# Patient Record
Sex: Female | Born: 1990 | Race: White | Hispanic: No | Marital: Married | Smoking: Never smoker
Health system: Southern US, Community
[De-identification: ages and names within clinical notes are randomized; demographics above are authoritative.]

## PROBLEM LIST (undated history)

## (undated) DIAGNOSIS — E7212 Methylenetetrahydrofolate reductase deficiency: Secondary | ICD-10-CM

## (undated) DIAGNOSIS — E079 Disorder of thyroid, unspecified: Secondary | ICD-10-CM

## (undated) DIAGNOSIS — Z1589 Genetic susceptibility to other disease: Secondary | ICD-10-CM

## (undated) DIAGNOSIS — F329 Major depressive disorder, single episode, unspecified: Secondary | ICD-10-CM

## (undated) DIAGNOSIS — N946 Dysmenorrhea, unspecified: Secondary | ICD-10-CM

## (undated) DIAGNOSIS — Z77120 Contact with and (suspected) exposure to mold (toxic): Secondary | ICD-10-CM

## (undated) DIAGNOSIS — E039 Hypothyroidism, unspecified: Secondary | ICD-10-CM

## (undated) DIAGNOSIS — G43909 Migraine, unspecified, not intractable, without status migrainosus: Secondary | ICD-10-CM

## (undated) DIAGNOSIS — F419 Anxiety disorder, unspecified: Secondary | ICD-10-CM

## (undated) DIAGNOSIS — F32A Depression, unspecified: Secondary | ICD-10-CM

## (undated) HISTORY — PX: WISDOM TOOTH EXTRACTION: SHX21

## (undated) HISTORY — DX: Contact with and (suspected) exposure to mold (toxic): Z77.120

## (undated) HISTORY — DX: Hypothyroidism, unspecified: E03.9

## (undated) HISTORY — DX: Genetic susceptibility to other disease: Z15.89

## (undated) HISTORY — DX: Anxiety disorder, unspecified: F41.9

## (undated) HISTORY — DX: Major depressive disorder, single episode, unspecified: F32.9

## (undated) HISTORY — PX: ABDOMINAL SURGERY: SHX537

## (undated) HISTORY — DX: Dysmenorrhea, unspecified: N94.6

## (undated) HISTORY — DX: Disorder of thyroid, unspecified: E07.9

## (undated) HISTORY — DX: Methylenetetrahydrofolate reductase deficiency: E72.12

## (undated) HISTORY — DX: Depression, unspecified: F32.A

## (undated) HISTORY — DX: Migraine, unspecified, not intractable, without status migrainosus: G43.909

---
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
## 2013-03-15 ENCOUNTER — Other Ambulatory Visit (HOSPITAL_COMMUNITY)
Admission: RE | Admit: 2013-03-15 | Discharge: 2013-03-15 | Disposition: A | Discharge status: Home / Self Care | Payer: 59 | Source: Ambulatory Visit | Attending: Family Medicine | Admitting: Family Medicine

## 2013-03-15 ENCOUNTER — Other Ambulatory Visit: Payer: Self-pay | Admitting: Family Medicine

## 2013-03-15 DIAGNOSIS — Z124 Encounter for screening for malignant neoplasm of cervix: Secondary | ICD-10-CM | POA: Insufficient documentation

## 2015-06-10 DIAGNOSIS — J160 Chlamydial pneumonia: Secondary | ICD-10-CM

## 2015-06-10 HISTORY — DX: Chlamydial pneumonia: J16.0

## 2016-01-23 ENCOUNTER — Other Ambulatory Visit: Payer: Self-pay | Admitting: Family Medicine

## 2016-01-23 ENCOUNTER — Other Ambulatory Visit (HOSPITAL_COMMUNITY)
Admission: RE | Admit: 2016-01-23 | Discharge: 2016-01-23 | Disposition: A | Discharge status: Home / Self Care | Payer: Managed Care, Other (non HMO) | Source: Ambulatory Visit | Attending: Family Medicine | Admitting: Family Medicine

## 2016-01-23 DIAGNOSIS — N6002 Solitary cyst of left breast: Secondary | ICD-10-CM

## 2016-01-23 DIAGNOSIS — Z124 Encounter for screening for malignant neoplasm of cervix: Secondary | ICD-10-CM | POA: Insufficient documentation

## 2016-01-28 LAB — CYTOLOGY - PAP

## 2016-01-29 ENCOUNTER — Ambulatory Visit
Admission: RE | Admit: 2016-01-29 | Discharge: 2016-01-29 | Disposition: A | Discharge status: Home / Self Care | Payer: Managed Care, Other (non HMO) | Source: Ambulatory Visit | Attending: Family Medicine | Admitting: Family Medicine

## 2016-01-29 DIAGNOSIS — N6002 Solitary cyst of left breast: Secondary | ICD-10-CM

## 2018-05-19 ENCOUNTER — Encounter: Payer: Self-pay | Admitting: Obstetrics and Gynecology

## 2018-06-09 NOTE — L&D Delivery Note (Signed)
Delivery Note At 6:25 PM a viable female was delivered via Vaginal, Spontaneous (Presentation: Middle Occiput Anterior).  APGAR: 9, 10; weight  .   Placenta status: Spontaneous, Intact.  Cord: 3 vessels with the following complications: None.  Anesthesia: General Episiotomy: Median Lacerations: Deep left sulcal with left labial extension' 2nd degree perineal Suture Repair: 3-0 vicryl, 2-vicryl Est. Blood Loss (mL):  1100cc  Upon delivery of infant and delivery of placenta, patient found to be comfortable with epidural anesthesia. Deep left sulcal laceration noted extending cranially 4cm. Dr Rip Harbour of [facility] called for assistance with retraction. Left sulcal closed in two layers for running locked 3-0 vicryl suture. Surgeon's finger used to ensure that dead space was closed, confirmed. 1cm labial extension on left repaired in running fashion. At this time, 2nd degree perineal laceration repaired with 2-0 vicryl suture. Surgeon's finger performed rectal exam without evidence of suture or collection concerning for hematoma/collection.   During completion of running subcuticular stitch for 2nd deg repair, pt noted to become hypotensive and nauseous with pallor. Please note - excellent hemostasis had been achieved by this time and uterus had remained firm throughout. Code hemorrhage was called and team placed Foley, 2nd IV, hung boluses x2 and drew labs (rainbow). Foley placed [id number] by Psychologist, sport and exercise. Upon completion of assessment and one-time phenylephrine, pt Bps now 100s/60s (as low as 50/29 previously). Repeat rectal exam as before. Patient with slowly improving color, nausea improved. Foley to stay in place. Will follow labs and VS  Patient stable upon completion of repair with excellent hemostasis, doing skin to skin with infant son whom she wants to breastfeed.  Mom to postpartum.  Baby to Couplet care / Skin to Skin.  Graham 05/23/2019, 7:33 PM

## 2018-06-10 ENCOUNTER — Other Ambulatory Visit: Payer: Self-pay

## 2018-06-10 ENCOUNTER — Ambulatory Visit (INDEPENDENT_AMBULATORY_CARE_PROVIDER_SITE_OTHER): Payer: 59 | Admitting: Obstetrics and Gynecology

## 2018-06-10 ENCOUNTER — Encounter: Payer: Self-pay | Admitting: Obstetrics and Gynecology

## 2018-06-10 VITALS — BP 118/82 | HR 64 | Wt 148.8 lb

## 2018-06-10 DIAGNOSIS — Z3009 Encounter for other general counseling and advice on contraception: Secondary | ICD-10-CM

## 2018-06-10 DIAGNOSIS — Z01419 Encounter for gynecological examination (general) (routine) without abnormal findings: Secondary | ICD-10-CM | POA: Diagnosis not present

## 2018-06-10 DIAGNOSIS — N926 Irregular menstruation, unspecified: Secondary | ICD-10-CM | POA: Diagnosis not present

## 2018-06-10 DIAGNOSIS — Z124 Encounter for screening for malignant neoplasm of cervix: Secondary | ICD-10-CM | POA: Diagnosis not present

## 2018-06-10 LAB — POCT URINE PREGNANCY: Preg Test, Ur: NEGATIVE

## 2018-06-10 NOTE — Progress Notes (Signed)
28 y.o. G0P0000 Married White or Caucasian Not Hispanic or Latino female here for annual exam.   Period Cycle (Days): 27 Period Duration (Days): 3 days Period Pattern: Regular Menstrual Flow: Light, Heavy Menstrual Control: Other (Comment)(diva cup) Menstrual Control Change Freq (Hours): empties diva cup every 8-12 hours Dysmenorrhea: (!) Moderate Dysmenorrhea Symptoms: Cramping  The second day is heavy and significant cramps.  She is homozygote for MTHFR gene, her homocystine level was slightly off, she is on vitamins.  She is considering pregnancy. She plans to wean off of her supplements.    Husband is healthy. No h/o genetic or chromosomal abnormalities on either side (other that MTHFR def)  Patient's last menstrual period was 05/11/2018 (exact date).          Sexually active: Yes.    The current method of family planning is condoms most of the time.    Exercising: Yes.    running, lifting, weights, walking Smoker:  no  Health Maintenance: Pap:  2017 normal per patient History of abnormal Pap:  no MMG: Left breast ultrasound 2017 normal per patient TDaP:  07/2008 does this with PCP Gardasil: completed all 3   reports that she has never smoked. She has never used smokeless tobacco. She reports current alcohol use of about 2.0 - 4.0 standard drinks of alcohol per week. She reports that she does not use drugs. 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 Will stay long term.   Past Medical History:  Diagnosis Date  . Anxiety   . Contact with and (suspected) exposure to mold (toxic)   . Depression   . Dysmenorrhea   . Hypothyroidism   . Migraines   . MTHFR gene mutation (HCC)   . Thyroid disease   Not having issues with Depression and Anxiety currently.   Past Surgical History:  Procedure Laterality Date  . ABDOMINAL SURGERY     hernia  . WISDOM TOOTH EXTRACTION      Current Outpatient Medications   Medication Sig Dispense Refill  . cetirizine (ZYRTEC) 10 MG tablet Take 10 mg by mouth daily.    Marland Kitchen CREATINE PO Take by mouth.    . cyanocobalamin 2000 MCG tablet Take 2,000 mcg by mouth daily.    . IRON PO Take by mouth. 36 mg daily    . Lactobacillus (PROBIOTIC ACIDOPHILUS PO) Take by mouth.    Marland Kitchen MAGNESIUM MALATE PO Take by mouth.    . Melatonin 10 MG CAPS Take by mouth.    . Omega-3 Fatty Acids (FISH OIL) 500 MG CAPS Take 3 capsules by mouth.    Marland Kitchen PHOSPHATIDYL CHOLINE PO Take by mouth.    . Pregnenolone POWD by Does not apply route.    Marland Kitchen QUERCETIN PO Take by mouth. 29 mg daily    . thyroid (ARMOUR) 30 MG tablet Take 30 mg by mouth daily before breakfast. Patient is taking 45 mg daily.    . TURMERIC PO Take by mouth.    Marland Kitchen UNABLE TO FIND Okra pepsin 2 a day    . UNABLE TO FIND Methyl-8 complex vitamin 1 lozenge daily    . Vitamin D, Ergocalciferol, (DRISDOL) 1.25 MG (50000 UT) CAPS capsule Take 50,000 Units by mouth every 7 (seven) days.    Marland Kitchen VITAMIN K PO Take by mouth.    . zinc gluconate 50 MG tablet Take 50 mg by mouth daily.     No  current facility-administered medications for this visit.     History reviewed. No pertinent family history.  Review of Systems  Constitutional: Negative.   HENT: Negative.   Eyes: Negative.   Respiratory: Negative.   Cardiovascular: Negative.   Gastrointestinal: Negative.   Endocrine: Negative.   Genitourinary: Negative.   Musculoskeletal: Negative.   Skin: Negative.   Allergic/Immunologic: Negative.   Neurological: Negative.   Hematological: Negative.   Psychiatric/Behavioral: Negative.     Exam:   BP 118/82 (BP Location: Right Arm, Patient Position: Sitting, Cuff Size: Normal)   Pulse 64   Wt 148 lb 12.8 oz (67.5 kg)   LMP 05/11/2018 (Exact Date)   Weight change: @WEIGHTCHANGE @ Height:      Ht Readings from Last 3 Encounters:  No data found for Ht    General appearance: alert, cooperative and appears stated age Head:  Normocephalic, without obvious abnormality, atraumatic Neck: no adenopathy, supple, symmetrical, trachea midline and thyroid normal to inspection and palpation Lungs: clear to auscultation bilaterally Cardiovascular: regular rate and rhythm Breasts: normal appearance, no masses or tenderness Abdomen: soft, non-tender; non distended,  no masses,  no organomegaly Extremities: extremities normal, atraumatic, no cyanosis or edema Skin: Skin color, texture, turgor normal. No rashes or lesions Lymph nodes: Cervical, supraclavicular, and axillary nodes normal. No abnormal inguinal nodes palpated Neurologic: Grossly normal   Pelvic: External genitalia:  no lesions              Urethra:  normal appearing urethra with no masses, tenderness or lesions              Bartholins and Skenes: normal                 Vagina: normal appearing vagina with normal color and discharge, no lesions              Cervix: no lesions               Bimanual Exam:  Uterus:  normal size, contour, position, consistency, mobility, non-tender and retroverted              Adnexa: no mass, fullness, tenderness               Rectovaginal: Confirms               Anus:  normal sphincter tone, no lesions  Chaperone was present for exam.  A:  Well Woman with normal exam  MTHFR gene mutation  Family planning  P:   Pap with reflex hpv  UPT negative(cycle is 4 days late). If no cycle by next week, she should call  Rubella today  Offered CF testing declined  Recommended she come off of supplements and go on PNV, the folic acid in the PNV is enough with her gene mutation  She should not be at an increased risk of clotting  Discussed breast self exam  Discussed calcium and vit D intake  Other labs with primary  Discussed good health prior to pregnancy  Other labs with primary

## 2018-06-10 NOTE — Patient Instructions (Signed)
Preparing for Pregnancy If you are considering becoming pregnant, make an appointment to see your regular health care provider to learn how to prepare for a safe and healthy pregnancy (preconception care). During a preconception care visit, your health care provider will:  Do a complete physical exam, including a Pap test.  Take a complete medical history.  Give you information, answer your questions, and help you resolve problems. Preconception checklist Medical history  Tell your health care provider about any current or past medical conditions. Your pregnancy or your ability to become pregnant may be affected by chronic conditions, such as diabetes, chronic hypertension, and thyroid problems.  Include your family's medical history as well as your partner's medical history.  Tell your health care provider about any history of STIs (sexually transmitted infections).These can affect your pregnancy. In some cases, they can be passed to your baby. Discuss any concerns that you have about STIs.  If indicated, discuss the benefits of genetic testing. This testing will show whether there are any genetic conditions that may be passed from you or your partner to your baby.  Tell your health care provider about: ? Any problems you have had with conception or pregnancy. ? Any medicines you take. These include vitamins, herbal supplements, and over-the-counter medicines. ? Your history of immunizations. Discuss any vaccinations that you may need. Diet  Ask your health care provider what to include in a healthy diet that has a balance of nutrients. This is especially important when you are pregnant or preparing to become pregnant.  Ask your health care provider to help you reach a healthy weight before pregnancy. ? If you are overweight, you may be at higher risk for certain complications, such as high blood pressure, diabetes, and preterm birth. ? If you are underweight, you are more likely to  have a baby who has a low birth weight. Lifestyle, work, and home  Let your health care provider know: ? About any lifestyle habits that you have, such as alcohol use, drug use, or smoking. ? About recreational activities that may put you at risk during pregnancy, such as downhill skiing and certain exercise programs. ? Tell your health care provider about any international travel, especially any travel to places with an active Zika virus outbreak. ? About harmful substances that you may be exposed to at work or at home. These include chemicals, pesticides, radiation, or even litter boxes. ? If you do not feel safe at home. Mental health  Tell your health care provider about: ? Any history of mental health conditions, including feelings of depression, sadness, or anxiety. ? Any medicines that you take for a mental health condition. These include herbs and supplements. Home instructions to prepare for pregnancy Lifestyle   Eat a balanced diet. This includes fresh fruits and vegetables, whole grains, lean meats, low-fat dairy products, healthy fats, and foods that are high in fiber. Ask to meet with a nutritionist or registered dietitian for assistance with meal planning and goals.  Get regular exercise. Try to be active for at least 30 minutes a day on most days of the week. Ask your health care provider which activities are safe during pregnancy.  Do not use any products that contain nicotine or tobacco, such as cigarettes and e-cigarettes. If you need help quitting, ask your health care provider.  Do not drink alcohol.  Do not take illegal drugs.  Maintain a healthy weight. Ask your health care provider what weight range is right for you. General   instructions  Keep an accurate record of your menstrual periods. This makes it easier for your health care provider to determine your baby's due date.  Begin taking prenatal vitamins and folic acid supplements daily as directed by your  health care provider.  Manage any chronic conditions, such as high blood pressure and diabetes, as told by your health care provider. This is important. How do I know that I am pregnant? You may be pregnant if you have been sexually active and you miss your period. Symptoms of early pregnancy include:  Mild cramping.  Very light vaginal bleeding (spotting).  Feeling unusually tired.  Nausea and vomiting (morning sickness). If you have any of these symptoms and you suspect that you might be pregnant, you can take a home pregnancy test. These tests check for a hormone in your urine (human chorionic gonadotropin, or hCG). A woman's body begins to make this hormone during early pregnancy. These tests are very accurate. Wait until at least the first day after you miss your period to take one. If the test shows that you are pregnant (you get a positive result), call your health care provider to make an appointment for prenatal care. What should I do if I become pregnant?      Make an appointment with your health care provider as soon as you suspect you are pregnant.  Do not use any products that contain nicotine, such as cigarettes, chewing tobacco, and e-cigarettes. If you need help quitting, ask your health care provider.  Do not drink alcoholic beverages. Alcohol is related to a number of birth defects.  Avoid toxic odors and chemicals.  You may continue to have sexual intercourse if it does not cause pain or other problems, such as vaginal bleeding. This information is not intended to replace advice given to you by your health care provider. Make sure you discuss any questions you have with your health care provider. Document Released: 05/08/2008 Document Revised: 05/28/2017 Document Reviewed: 12/16/2015 Elsevier Interactive Patient Education  2019 [employer].   EXERCISE AND DIET:  We recommended that you start or continue a regular exercise program for good health. Regular exercise  means any activity that makes your heart beat faster and makes you sweat.  We recommend exercising at least 30 minutes per day at least 3 days a week, preferably 4 or 5.  We also recommend a diet low in fat and sugar.  Inactivity, poor dietary choices and obesity can cause diabetes, heart attack, stroke, and kidney damage, among others.    ALCOHOL AND SMOKING:  Women should limit their alcohol intake to no more than 7 drinks/beers/glasses of wine (combined, not each!) per week. Moderation of alcohol intake to this level decreases your risk of breast cancer and liver damage. And of course, no recreational drugs are part of a healthy lifestyle.  And absolutely no smoking or even second hand smoke. Most people know smoking can cause heart and lung diseases, but did you know it also contributes to weakening of your bones? Aging of your skin?  Yellowing of your teeth and nails?  CALCIUM AND VITAMIN D:  Adequate intake of calcium and Vitamin D are recommended.  The recommendations for exact amounts of these supplements seem to change often, but generally speaking 1,000 mg of calcium (between diet and supplement) and 800 units of Vitamin D per day seems prudent. Certain women may benefit from higher intake of Vitamin D.  If you are among these women, your doctor will have told  you during your visit.    PAP SMEARS:  Pap smears, to check for cervical cancer or precancers,  have traditionally been done yearly, although recent scientific advances have shown that most women can have pap smears less often.  However, every woman still should have a physical exam from her gynecologist every year. It will include a breast check, inspection of the vulva and vagina to check for abnormal growths or skin changes, a visual exam of the cervix, and then an exam to evaluate the size and shape of the uterus and ovaries.  And after 27 years of age, a rectal exam is indicated to check for rectal cancers. We will also provide age  appropriate advice regarding health maintenance, like when you should have certain vaccines, screening for sexually transmitted diseases, bone density testing, colonoscopy, mammograms, etc.   MAMMOGRAMS:  All women over 27 years old should have a yearly mammogram. Many facilities now offer a "3D" mammogram, which may cost around $50 extra out of pocket. If possible,  we recommend you accept the option to have the 3D mammogram performed.  It both reduces the number of women who will be called back for extra views which then turn out to be normal, and it is better than the routine mammogram at detecting truly abnormal areas.    COLON CANCER SCREENING: Now recommend starting at age 745. At this time colonoscopy is not covered for routine screening until 50. There are take home tests that can be done between 45-49.   COLONOSCOPY:  Colonoscopy to screen for colon cancer is recommended for all women at age 27.  We know, you hate the idea of the prep.  We agree, BUT, having colon cancer and not knowing it is worse!!  Colon cancer so often starts as a polyp that can be seen and removed at colonscopy, which can quite literally save your life!  And if your first colonoscopy is normal and you have no family history of colon cancer, most women don't have to have it again for 10 years.  Once every ten years, you can do something that may end up saving your life, right?  We will be happy to help you get it scheduled when you are ready.  Be sure to check your insurance coverage so you understand how much it will cost.  It may be covered as a preventative service at no cost, but you should check your particular policy.      Breast Self-Awareness Breast self-awareness means being familiar with how your breasts look and feel. It involves checking your breasts regularly and reporting any changes to your health care provider. Practicing breast self-awareness is important. A change in your breasts can be a sign of a serious  medical problem. Being familiar with how your breasts look and feel allows you to find any problems early, when treatment is more likely to be successful. All women should practice breast self-awareness, including women who have had breast implants. How to do a breast self-exam One way to learn what is normal for your breasts and whether your breasts are changing is to do a breast self-exam. To do a breast self-exam: Look for Changes  1. Remove all the clothing above your waist. 2. Stand in front of a mirror in a room with good lighting. 3. Put your hands on your hips. 4. Push your hands firmly downward. 5. Compare your breasts in the mirror. Look for differences between them (asymmetry), such as: ? Differences in shape. ?  Differences in size. °? Puckers, dips, and bumps in one breast and not the other. °6. Look at each breast for changes in your skin, such as: °? Redness. °? Scaly areas. °7. Look for changes in your nipples, such as: °? Discharge. °? Bleeding. °? Dimpling. °? Redness. °? A change in position. °Feel for Changes °Carefully feel your breasts for lumps and changes. It is best to do this while lying on your back on the floor and again while sitting or standing in the shower or tub with soapy water on your skin. Feel each breast in the following way: °· Place the arm on the side of the breast you are examining above your head. °· Feel your breast with the other hand. °· Start in the nipple area and make ¾ inch (2 cm) overlapping circles to feel your breast. Use the pads of your three middle fingers to do this. Apply light pressure, then medium pressure, then firm pressure. The light pressure will allow you to feel the tissue closest to the skin. The medium pressure will allow you to feel the tissue that is a little deeper. The firm pressure will allow you to feel the tissue close to the ribs. °· Continue the overlapping circles, moving downward over the breast until you feel your ribs below  your breast. °· Move one finger-width toward the center of the body. Continue to use the ¾ inch (2 cm) overlapping circles to feel your breast as you move slowly up toward your collarbone. °· Continue the up and down exam using all three pressures until you reach your armpit. ° °Write Down What You Find ° °Write down what is normal for each breast and any changes that you find. Keep a written record with breast changes or normal findings for each breast. By writing this information down, you do not need to depend only on memory for size, tenderness, or location. Write down where you are in your menstrual cycle, if you are still menstruating. °If you are having trouble noticing differences in your breasts, do not get discouraged. With time you will become more familiar with the variations in your breasts and more comfortable with the exam. °How often should I examine my breasts? °Examine your breasts every month. If you are breastfeeding, the best time to examine your breasts is after a feeding or after using a breast pump. If you menstruate, the best time to examine your breasts is 5-7 days after your period is over. During your period, your breasts are lumpier, and it may be more difficult to notice changes. °When should I see my health care provider? °See your health care provider if you notice: °· A change in shape or size of your breasts or nipples. °· A change in the skin of your breast or nipples, such as a reddened or scaly area. °· Unusual discharge from your nipples. °· A lump or thick area that was not there before. °· Pain in your breasts. °· Anything that concerns you. ° °

## 2018-06-14 LAB — RUBELLA SCREEN: RUBELLA: 8.5 {index} (ref >0.99)

## 2018-06-15 ENCOUNTER — Other Ambulatory Visit (HOSPITAL_COMMUNITY)
Admission: RE | Admit: 2018-06-15 | Discharge: 2018-06-15 | Disposition: A | Discharge status: Home / Self Care | Payer: 59 | Source: Ambulatory Visit | Attending: Obstetrics and Gynecology | Admitting: Obstetrics and Gynecology

## 2018-06-15 DIAGNOSIS — Z124 Encounter for screening for malignant neoplasm of cervix: Secondary | ICD-10-CM | POA: Insufficient documentation

## 2018-06-15 NOTE — Addendum Note (Signed)
Addended by: Tobi BastosJERTSON, Renn Stille E on: 06/15/2018 11:09 AM   Modules accepted: Orders

## 2018-06-18 LAB — CYTOLOGY - PAP: Diagnosis: NEGATIVE

## 2018-09-20 ENCOUNTER — Telehealth: Payer: Self-pay | Admitting: Obstetrics and Gynecology

## 2018-09-20 NOTE — Telephone Encounter (Signed)
Spoke with patient. Patients LMP was 08/25/2018. Took UPT that is positive. Denies any pain or bleeding. Had mild cramping early on, but has none currently. Denies any concerns or problems. No history of pelvic infection or surgery. Provided contact information to [facility] and [facility] for patient to call and schedule an appointment as our office is unable to do pregnancy confirmation appointments due to COVID 19 at this time. Patient verbalizes understanding. Advised if she has any bleeding or pain will need to contact the office immediately for evaluation or be seen in the ER. Patient verbalizes understanding.  Routing to provider and will close encounter.

## 2018-09-20 NOTE — Telephone Encounter (Signed)
Patient would like to come in for pregnancy confirmation. 

## 2018-10-27 LAB — OB RESULTS CONSOLE HIV ANTIBODY (ROUTINE TESTING): HIV: NONREACTIVE

## 2018-10-27 LAB — OB RESULTS CONSOLE GC/CHLAMYDIA
Chlamydia: NEGATIVE
Gonorrhea: NEGATIVE

## 2018-10-27 LAB — OB RESULTS CONSOLE RUBELLA ANTIBODY, IGM: Rubella: IMMUNE

## 2018-10-27 LAB — OB RESULTS CONSOLE VARICELLA ZOSTER ANTIBODY, IGG: Varicella: IMMUNE

## 2018-10-27 LAB — OB RESULTS CONSOLE HEPATITIS B SURFACE ANTIGEN: Hepatitis B Surface Ag: NEGATIVE

## 2018-11-23 ENCOUNTER — Other Ambulatory Visit (HOSPITAL_COMMUNITY): Payer: Self-pay | Admitting: Obstetrics and Gynecology

## 2018-11-23 DIAGNOSIS — Z1329 Encounter for screening for other suspected endocrine disorder: Secondary | ICD-10-CM

## 2018-11-23 DIAGNOSIS — Z3689 Encounter for other specified antenatal screening: Secondary | ICD-10-CM

## 2018-11-23 DIAGNOSIS — Z3A18 18 weeks gestation of pregnancy: Secondary | ICD-10-CM

## 2018-12-27 ENCOUNTER — Encounter (HOSPITAL_COMMUNITY): Payer: Self-pay | Admitting: *Deleted

## 2018-12-29 ENCOUNTER — Encounter (HOSPITAL_COMMUNITY): Payer: Self-pay

## 2018-12-29 ENCOUNTER — Other Ambulatory Visit: Payer: Self-pay

## 2018-12-29 ENCOUNTER — Ambulatory Visit (HOSPITAL_COMMUNITY)
Admission: RE | Admit: 2018-12-29 | Discharge: 2018-12-29 | Disposition: A | Discharge status: Home / Self Care | Payer: 59 | Source: Ambulatory Visit | Attending: Obstetrics and Gynecology | Admitting: Obstetrics and Gynecology

## 2018-12-29 ENCOUNTER — Ambulatory Visit (HOSPITAL_COMMUNITY): Payer: 59 | Admitting: *Deleted

## 2018-12-29 VITALS — BP 113/64 | HR 88 | Temp 98.4°F | Ht 64.0 in

## 2018-12-29 DIAGNOSIS — Z79899 Other long term (current) drug therapy: Secondary | ICD-10-CM | POA: Insufficient documentation

## 2018-12-29 DIAGNOSIS — O99282 Endocrine, nutritional and metabolic diseases complicating pregnancy, second trimester: Secondary | ICD-10-CM | POA: Diagnosis not present

## 2018-12-29 DIAGNOSIS — O2692 Pregnancy related conditions, unspecified, second trimester: Secondary | ICD-10-CM

## 2018-12-29 DIAGNOSIS — Z3689 Encounter for other specified antenatal screening: Secondary | ICD-10-CM | POA: Diagnosis present

## 2018-12-29 DIAGNOSIS — E039 Hypothyroidism, unspecified: Secondary | ICD-10-CM | POA: Insufficient documentation

## 2018-12-29 DIAGNOSIS — O0990 Supervision of high risk pregnancy, unspecified, unspecified trimester: Secondary | ICD-10-CM

## 2018-12-29 DIAGNOSIS — Z3A18 18 weeks gestation of pregnancy: Secondary | ICD-10-CM | POA: Diagnosis not present

## 2018-12-29 DIAGNOSIS — Z1329 Encounter for screening for other suspected endocrine disorder: Secondary | ICD-10-CM

## 2018-12-29 DIAGNOSIS — Z363 Encounter for antenatal screening for malformations: Secondary | ICD-10-CM | POA: Insufficient documentation

## 2018-12-29 DIAGNOSIS — O321XX0 Maternal care for breech presentation, not applicable or unspecified: Secondary | ICD-10-CM | POA: Insufficient documentation

## 2018-12-30 ENCOUNTER — Other Ambulatory Visit (HOSPITAL_COMMUNITY): Payer: Self-pay | Admitting: *Deleted

## 2018-12-30 DIAGNOSIS — E039 Hypothyroidism, unspecified: Secondary | ICD-10-CM

## 2019-02-28 ENCOUNTER — Ambulatory Visit (HOSPITAL_COMMUNITY): Payer: 59

## 2019-02-28 ENCOUNTER — Encounter (HOSPITAL_COMMUNITY): Payer: Self-pay

## 2019-05-06 ENCOUNTER — Encounter (HOSPITAL_COMMUNITY): Payer: Self-pay

## 2019-05-06 ENCOUNTER — Other Ambulatory Visit: Payer: Self-pay

## 2019-05-06 ENCOUNTER — Inpatient Hospital Stay (HOSPITAL_COMMUNITY)
Admission: AD | Admit: 2019-05-06 | Discharge: 2019-05-06 | Disposition: A | Discharge status: Home / Self Care | Payer: 59 | Attending: Obstetrics and Gynecology | Admitting: Obstetrics and Gynecology

## 2019-05-06 DIAGNOSIS — O479 False labor, unspecified: Secondary | ICD-10-CM

## 2019-05-06 DIAGNOSIS — O471 False labor at or after 37 completed weeks of gestation: Secondary | ICD-10-CM | POA: Insufficient documentation

## 2019-05-06 NOTE — Discharge Instructions (Signed)
Vaginal delivery means that you give birth by pushing your baby out of your birth canal (vagina). A team of health care providers will help you before, during, and after vaginal delivery. Birth experiences are unique for every woman and every pregnancy, and birth experiences vary depending on where you choose to give birth. What happens when I arrive at the birth center or hospital? Once you are in labor and have been admitted into the hospital or birth center, your health care provider may:  Review your pregnancy history and any concerns that you have.  Insert an IV into one of your veins. This may be used to give you fluids and medicines.  Check your blood pressure, pulse, temperature, and heart rate (vital signs).  Check whether your bag of water (amniotic sac) has broken (ruptured).  Talk with you about your birth plan and discuss pain control options. Monitoring Your health care provider may monitor your contractions (uterine monitoring) and your baby's heart rate (fetal monitoring). You may need to be monitored:  Often, but not continuously (intermittently).  All the time or for long periods at a time (continuously). Continuous monitoring may be needed if: ? You are taking certain medicines, such as medicine to relieve pain or make your contractions stronger. ? You have pregnancy or labor complications. Monitoring may be done by:  Placing a special stethoscope or a handheld monitoring device on your abdomen to check your baby's heartbeat and to check for contractions.  Placing monitors on your abdomen (external monitors) to record your baby's heartbeat and the frequency and length of contractions.  Placing monitors inside your uterus through your vagina (internal monitors) to record your baby's heartbeat and the frequency, length, and strength of your contractions. Depending on the type of monitor, it may remain in your uterus or on your baby's head until birth.  Telemetry. This  is a type of continuous monitoring that can be done with external or internal monitors. Instead of having to stay in bed, you are able to move around during telemetry. Physical exam Your health care provider may perform frequent physical exams. This may include:  Checking how and where your baby is positioned in your uterus.  Checking your cervix to determine: ? Whether it is thinning out (effacing). ? Whether it is opening up (dilating). What happens during labor and delivery?  Normal labor and delivery is divided into the following three stages: Stage 1  This is the longest stage of labor.  This stage can last for hours or days.  Throughout this stage, you will feel contractions. Contractions generally feel mild, infrequent, and irregular at first. They get stronger, more frequent (about every 2-3 minutes), and more regular as you move through this stage.  This stage ends when your cervix is completely dilated to 4 inches (10 cm) and completely effaced. Stage 2  This stage starts once your cervix is completely effaced and dilated and lasts until the delivery of your baby.  This stage may last from 20 minutes to 2 hours.  This is the stage where you will feel an urge to push your baby out of your vagina.  You may feel stretching and burning pain, especially when the widest part of your baby's head passes through the vaginal opening (crowning).  Once your baby is delivered, the umbilical cord will be clamped and cut. This usually occurs after waiting a period of 1-2 minutes after delivery.  Your baby will be placed on your bare chest (skin-to-skin contact) in   in an upright position and covered with a warm blanket. Watch your baby for feeding cues, like rooting or sucking, and help the baby to your breast for his or her first feeding. Stage 3  This stage starts immediately after the birth of your baby and ends after you deliver the placenta.  This stage may take anywhere from 5  to 30 minutes.  After your baby has been delivered, you will feel contractions as your body expels the placenta and your uterus contracts to control bleeding. What can I expect after labor and delivery?  After labor is over, you and your baby will be monitored closely until you are ready to go home to ensure that you are both healthy. Your health care team will teach you how to care for yourself and your baby.  You and your baby will stay in the same room (rooming in) during your hospital stay. This will encourage early bonding and successful breastfeeding.  You may continue to receive fluids and medicines through an IV.  Your uterus will be checked and massaged regularly (fundal massage).  You will have some soreness and pain in your abdomen, vagina, and the area of skin between your vaginal opening and your anus (perineum).  If an incision was made near your vagina (episiotomy) or if you had some vaginal tearing during delivery, cold compresses may be placed on your episiotomy or your tear. This helps to reduce pain and swelling.  You may be given a squirt bottle to use instead of wiping when you go to the bathroom. To use the squirt bottle, follow these steps: ? Before you urinate, fill the squirt bottle with warm water. Do not use hot water. ? After you urinate, while you are sitting on the toilet, use the squirt bottle to rinse the area around your urethra and vaginal opening. This rinses away any urine and blood. ? Fill the squirt bottle with clean water every time you use the bathroom.  It is normal to have vaginal bleeding after delivery. Wear a sanitary pad for vaginal bleeding and discharge. Summary  Vaginal delivery means that you will give birth by pushing your baby out of your birth canal (vagina).  Your health care provider may monitor your contractions (uterine monitoring) and your baby's heart rate (fetal monitoring).  Your health care provider may perform a physical  exam.  Normal labor and delivery is divided into three stages.  After labor is over, you and your baby will be monitored closely until you are ready to go home. This information is not intended to replace advice given to you by your health care provider. Make sure you discuss any questions you have with your health care provider. Document Released: 03/04/2008 Document Revised: 06/30/2017 Document Reviewed: 06/30/2017 Elsevier Patient Education  2020 [employer].

## 2019-05-06 NOTE — Progress Notes (Signed)
Pt states last SVE was 11/20 & was dilated FT & soft. States next reg OV is Monday.

## 2019-05-06 NOTE — MAU Note (Signed)
Pt has had BH contractions since last week. Last night she began feeling stronger contractions. They were 3 mins apart. Denies LOF or VB. +FM.

## 2019-05-10 ENCOUNTER — Inpatient Hospital Stay (HOSPITAL_COMMUNITY)
Admission: AD | Admit: 2019-05-10 | Discharge: 2019-05-11 | Disposition: A | Discharge status: Home / Self Care | Payer: 59 | Source: Ambulatory Visit | Attending: Obstetrics and Gynecology | Admitting: Obstetrics and Gynecology

## 2019-05-10 ENCOUNTER — Other Ambulatory Visit: Payer: Self-pay

## 2019-05-10 ENCOUNTER — Encounter (HOSPITAL_COMMUNITY): Payer: Self-pay

## 2019-05-10 DIAGNOSIS — Z3A36 36 weeks gestation of pregnancy: Secondary | ICD-10-CM

## 2019-05-10 DIAGNOSIS — N898 Other specified noninflammatory disorders of vagina: Secondary | ICD-10-CM | POA: Diagnosis not present

## 2019-05-10 DIAGNOSIS — O26893 Other specified pregnancy related conditions, third trimester: Secondary | ICD-10-CM | POA: Diagnosis not present

## 2019-05-10 DIAGNOSIS — E039 Hypothyroidism, unspecified: Secondary | ICD-10-CM | POA: Insufficient documentation

## 2019-05-10 DIAGNOSIS — Z79899 Other long term (current) drug therapy: Secondary | ICD-10-CM | POA: Insufficient documentation

## 2019-05-10 DIAGNOSIS — O99283 Endocrine, nutritional and metabolic diseases complicating pregnancy, third trimester: Secondary | ICD-10-CM | POA: Insufficient documentation

## 2019-05-10 DIAGNOSIS — Z3A37 37 weeks gestation of pregnancy: Secondary | ICD-10-CM | POA: Insufficient documentation

## 2019-05-10 NOTE — MAU Provider Note (Signed)
Chief Complaint:  Rupture of Membranes   First Provider Initiated Contact with Patient 05/10/19 2341     HPI: Lori Campbell is a 28 y.o. G1P0000 at 33w6dwho presents to maternity admissions reporting possible leakage of fluid.  Was enough to require a minipad be changed hourly.  Did not soak outer clothes. She reports good fetal movement, denies vaginal bleeding, vaginal itching/burning, urinary symptoms, h/a, dizziness, n/v, diarrhea, constipation or fever/chills.    Vaginal Discharge The patient's primary symptoms include vaginal discharge. The patient's pertinent negatives include no genital itching, genital lesions, genital odor, pelvic pain or vaginal bleeding. This is a new problem. The current episode started yesterday. The problem occurs intermittently. The problem has been unchanged. She is pregnant. Pertinent negatives include no back pain, chills, constipation, diarrhea, fever, nausea or vomiting. The vaginal discharge was clear and milky. There has been no bleeding. She has not been passing clots. She has not been passing tissue. Nothing aggravates the symptoms. She has tried nothing for the symptoms.   RN Note: Thinks maybe water started leaking yesterday morning.  Reports had dr appt yesterday but didn't mention because been having heavier discharge and bloody show.   Underwear has been consistently damp, clear.  Baby moving a lot today.  Contractions for 2 weeks. Slightly more than 1 cm yesterday.   Past Medical History: Past Medical History:  Diagnosis Date  . Anxiety    does not take meds  . Chlamydia psittaci pneumonia 2017  . Contact with and (suspected) exposure to mold (toxic)   . Depression    does not take meds  . Dysmenorrhea   . Hypothyroidism   . Migraines   . MTHFR gene mutation ([facility])   . Thyroid disease     Past obstetric history: OB History  Gravida Para Term Preterm AB Living  1 0 0 0 0 0  SAB TAB Ectopic Multiple Live Births  0 0 0 0 0    # Outcome  Date GA Lbr Len/2nd Weight Sex Delivery Anes PTL Lv  1 Current             Past Surgical History: Past Surgical History:  Procedure Laterality Date  . ABDOMINAL SURGERY     hernia  . HERNIA REPAIR    . WISDOM TOOTH EXTRACTION      Family History: History reviewed. No pertinent family history.  Social History: Social History   Tobacco Use  . Smoking status: Never Smoker  . Smokeless tobacco: Never Used  Substance Use Topics  . Alcohol use: Not Currently    Alcohol/week: 2.0 - 4.0 standard drinks    Types: 2 - 4 Standard drinks or equivalent per week  . Drug use: Never    Allergies:  Allergies  Allergen Reactions  . Lactose Intolerance (Gi)     Meds:  Medications Prior to Admission  Medication Sig Dispense Refill Last Dose  . Ascorbic Acid (VITAMIN C) 1000 MG tablet Take 2,000 mg by mouth daily.     . CHOLINE PO Take by mouth.     . Docosahexaenoic Acid (DHA COMPLETE PO) Take by mouth.     . Lactobacillus (PROBIOTIC ACIDOPHILUS PO) Take by mouth.     . Loratadine (CLARITIN PO) Take by mouth.     Marland Kitchen MAGNESIUM MALATE PO Take by mouth.     . Prenatal Vit-Fe Fumarate-FA (PRENATAL VITAMIN PO) Take by mouth.     . thyroid (ARMOUR) 30 MG tablet Take 30 mg by mouth daily before  breakfast. Patient is taking 45 mg daily.     Marland Kitchen UNABLE TO FIND Methyl-8 complex vitamin 1 lozenge daily       I have reviewed patient's Past Medical Hx, Surgical Hx, Family Hx, Social Hx, medications and allergies.   ROS:  Review of Systems  Constitutional: Negative for chills and fever.  Gastrointestinal: Negative for constipation, diarrhea, nausea and vomiting.  Genitourinary: Positive for vaginal discharge. Negative for pelvic pain.  Musculoskeletal: Negative for back pain.   Other systems negative  Physical Exam  No data found. Constitutional: Well-developed, well-nourished female in no acute distress.  Cardiovascular: normal rate and rhythm Respiratory: normal effort, clear to  auscultation bilaterally GI: Abd soft, non-tender, gravid appropriate for gestational age.   No rebound or guarding. MS: Extremities nontender, no edema, normal ROM Neurologic: Alert and oriented x 4.  GU: Neg CVAT.  PELVIC EXAM: Cervix pink, visually closed, without lesion, scant white creamy discharge, vaginal walls and external genitalia normal   FHT:  Baseline 140 , moderate variability, accelerations present, no decelerations Contractions:  Irregular    Labs: Results for orders placed or performed during the hospital encounter of 05/10/19 (from the past 24 hour(s))  POCT fern test     Status: None   Collection Time: 05/11/19 12:01 AM  Result Value Ref Range   POCT Fern Test Negative = intact amniotic membranes       Imaging:  No results found.  MAU Course/MDM: NST reviewed, reactive category 1 with irregular mild contractions Reviewed findiings of exam and negative ferning Labor and ROM precautions  Treatments in MAU included EFM.    Assessment: Single intrauterine pregnancy at.[redacted]w[redacted]d Vaginal discharge in pregnancy No evidence of ruptured membranes Reactive FHR tracing, Category 1  Plan: Discharge home Labor precautions and fetal kick counts Follow up in Office for prenatal visits   Encouraged to return here or to other Urgent Care/ED if she develops worsening of symptoms, increase in pain, fever, or other concerning symptoms.  Pt stable at time of discharge.  Wynelle Bourgeois CNM, MSN Certified Nurse-Midwife 05/10/2019 11:41 PM

## 2019-05-10 NOTE — Discharge Instructions (Signed)

## 2019-05-10 NOTE — MAU Note (Signed)
Thinks maybe water started leaking yesterday morning.  Reports had dr appt yesterday but didn't mention because been having heavier discharge and bloody show.   Underwear has been consistently damp, clear.  Baby moving a lot today.  Contractions for 2 weeks. Slightly more than 1 cm yesterday.

## 2019-05-11 DIAGNOSIS — Z3A37 37 weeks gestation of pregnancy: Secondary | ICD-10-CM | POA: Diagnosis not present

## 2019-05-11 DIAGNOSIS — O99283 Endocrine, nutritional and metabolic diseases complicating pregnancy, third trimester: Secondary | ICD-10-CM | POA: Diagnosis not present

## 2019-05-11 DIAGNOSIS — O26893 Other specified pregnancy related conditions, third trimester: Secondary | ICD-10-CM | POA: Diagnosis not present

## 2019-05-11 DIAGNOSIS — Z79899 Other long term (current) drug therapy: Secondary | ICD-10-CM | POA: Diagnosis not present

## 2019-05-11 DIAGNOSIS — N898 Other specified noninflammatory disorders of vagina: Secondary | ICD-10-CM | POA: Diagnosis present

## 2019-05-11 DIAGNOSIS — E039 Hypothyroidism, unspecified: Secondary | ICD-10-CM | POA: Diagnosis not present

## 2019-05-11 LAB — POCT FERN TEST: POCT Fern Test: NEGATIVE

## 2019-05-12 LAB — OB RESULTS CONSOLE GBS: GBS: NEGATIVE

## 2019-05-23 ENCOUNTER — Inpatient Hospital Stay (HOSPITAL_COMMUNITY): Payer: 59 | Admitting: Anesthesiology

## 2019-05-23 ENCOUNTER — Encounter (HOSPITAL_COMMUNITY): Payer: Self-pay | Admitting: Obstetrics and Gynecology

## 2019-05-23 ENCOUNTER — Other Ambulatory Visit: Payer: Self-pay

## 2019-05-23 ENCOUNTER — Inpatient Hospital Stay (HOSPITAL_COMMUNITY)
Admission: AD | Admit: 2019-05-23 | Discharge: 2019-05-25 | DRG: 806 | Disposition: A | Discharge status: Home / Self Care | Payer: 59 | Attending: Obstetrics and Gynecology | Admitting: Obstetrics and Gynecology

## 2019-05-23 DIAGNOSIS — D62 Acute posthemorrhagic anemia: Secondary | ICD-10-CM | POA: Diagnosis not present

## 2019-05-23 DIAGNOSIS — Z3A38 38 weeks gestation of pregnancy: Secondary | ICD-10-CM

## 2019-05-23 DIAGNOSIS — E7212 Methylenetetrahydrofolate reductase deficiency: Secondary | ICD-10-CM | POA: Diagnosis present

## 2019-05-23 DIAGNOSIS — O4292 Full-term premature rupture of membranes, unspecified as to length of time between rupture and onset of labor: Secondary | ICD-10-CM | POA: Diagnosis present

## 2019-05-23 DIAGNOSIS — O9081 Anemia of the puerperium: Secondary | ICD-10-CM | POA: Diagnosis not present

## 2019-05-23 DIAGNOSIS — O99284 Endocrine, nutritional and metabolic diseases complicating childbirth: Secondary | ICD-10-CM | POA: Diagnosis present

## 2019-05-23 DIAGNOSIS — E039 Hypothyroidism, unspecified: Secondary | ICD-10-CM | POA: Diagnosis present

## 2019-05-23 DIAGNOSIS — Z20828 Contact with and (suspected) exposure to other viral communicable diseases: Secondary | ICD-10-CM | POA: Diagnosis present

## 2019-05-23 DIAGNOSIS — Z3490 Encounter for supervision of normal pregnancy, unspecified, unspecified trimester: Secondary | ICD-10-CM

## 2019-05-23 LAB — POCT FERN TEST: POCT Fern Test: POSITIVE

## 2019-05-23 LAB — COMPREHENSIVE METABOLIC PANEL
ALT: 20 U/L (ref 0–44)
AST: 29 U/L (ref 15–41)
Albumin: 2.6 g/dL — ABNORMAL LOW (ref 3.5–5.0)
Alkaline Phosphatase: 145 U/L — ABNORMAL HIGH (ref 38–126)
Anion gap: 11 (ref 5–15)
BUN: 6 mg/dL (ref 6–20)
CO2: 18 mmol/L — ABNORMAL LOW (ref 22–32)
Calcium: 8.7 mg/dL — ABNORMAL LOW (ref 8.9–10.3)
Chloride: 107 mmol/L (ref 98–111)
Creatinine, Ser: 0.77 mg/dL (ref 0.44–1.00)
GFR calc Af Amer: >60 mL/min (ref >60)
GFR calc non Af Amer: >60 mL/min (ref >60)
Glucose, Bld: 121 mg/dL — ABNORMAL HIGH (ref 70–99)
Potassium: 3.8 mmol/L (ref 3.5–5.1)
Sodium: 136 mmol/L (ref 135–145)
Total Bilirubin: 0.9 mg/dL (ref 0.3–1.2)
Total Protein: 5.4 g/dL — ABNORMAL LOW (ref 6.5–8.1)

## 2019-05-23 LAB — RPR: RPR Ser Ql: NONREACTIVE

## 2019-05-23 LAB — CBC
HCT: 33.7 % — ABNORMAL LOW (ref 36.0–46.0)
HCT: 28.7 % — ABNORMAL LOW (ref 36.0–46.0)
Hemoglobin: 11.7 g/dL — ABNORMAL LOW (ref 12.0–15.0)
Hemoglobin: 9.9 g/dL — ABNORMAL LOW (ref 12.0–15.0)
MCH: 31.1 pg (ref 26.0–34.0)
MCH: 30.9 pg (ref 26.0–34.0)
MCHC: 34.7 g/dL (ref 30.0–36.0)
MCHC: 34.5 g/dL (ref 30.0–36.0)
MCV: 89.6 fL (ref 80.0–100.0)
MCV: 89.7 fL (ref 80.0–100.0)
Platelets: 151 10*3/uL (ref 150–400)
Platelets: 192 10*3/uL (ref 150–400)
RBC: 3.76 MIL/uL — ABNORMAL LOW (ref 3.87–5.11)
RBC: 3.2 MIL/uL — ABNORMAL LOW (ref 3.87–5.11)
RDW: 12.5 % (ref 11.5–15.5)
RDW: 12.4 % (ref 11.5–15.5)
WBC: 12.7 10*3/uL — ABNORMAL HIGH (ref 4.0–10.5)
WBC: 16.1 10*3/uL — ABNORMAL HIGH (ref 4.0–10.5)
nRBC: 0 % (ref 0.0–0.2)
nRBC: 0 % (ref 0.0–0.2)

## 2019-05-23 LAB — DIC (DISSEMINATED INTRAVASCULAR COAGULATION)PANEL
D-Dimer, Quant: 4.92 ug/mL-FEU — ABNORMAL HIGH (ref 0.00–0.50)
Fibrinogen: 500 mg/dL — ABNORMAL HIGH (ref 210–475)
INR: 1.1 (ref 0.8–1.2)
Platelets: 189 10*3/uL (ref 150–400)
Prothrombin Time: 14 seconds (ref 11.4–15.2)
Smear Review: NONE SEEN
aPTT: 25 seconds (ref 24–36)

## 2019-05-23 LAB — SARS CORONAVIRUS 2 (TAT 6-24 HRS): SARS Coronavirus 2: NEGATIVE

## 2019-05-23 LAB — ABO/RH: ABO/RH(D): O POS

## 2019-05-23 MED ORDER — SOD CITRATE-CITRIC ACID 500-334 MG/5ML PO SOLN: 30.0000 mL | ORAL | Status: DC | PRN

## 2019-05-23 MED ORDER — DIBUCAINE (PERIANAL) 1 % EX OINT: 1.0000 "application " | TOPICAL_OINTMENT | CUTANEOUS | Status: DC | PRN

## 2019-05-23 MED ORDER — OXYTOCIN 40 UNITS IN NORMAL SALINE INFUSION - SIMPLE MED
2.5000 [IU]/h | INTRAVENOUS | Status: DC
  Administered 2019-05-23: 19:00:00 2.5 [IU]/h via INTRAVENOUS

## 2019-05-23 MED ORDER — LIDOCAINE HCL (PF) 1 % IJ SOLN
30.0000 mL | INTRAMUSCULAR | Status: AC | PRN
  Administered 2019-05-23: 11 mL via SUBCUTANEOUS

## 2019-05-23 MED ORDER — OXYCODONE HCL 5 MG PO TABS: 5.0000 mg | ORAL_TABLET | ORAL | Status: DC | PRN

## 2019-05-23 MED ORDER — OXYCODONE-ACETAMINOPHEN 5-325 MG PO TABS: 1.0000 | ORAL_TABLET | ORAL | Status: DC | PRN

## 2019-05-23 MED ORDER — SODIUM CHLORIDE (PF) 0.9 % IJ SOLN
INTRAMUSCULAR | Status: DC | PRN
  Administered 2019-05-23: 12 mL/h via EPIDURAL

## 2019-05-23 MED ORDER — ONDANSETRON HCL 4 MG/2ML IJ SOLN: 4.0000 mg | INTRAMUSCULAR | Status: DC | PRN

## 2019-05-23 MED ORDER — EPHEDRINE 5 MG/ML INJ: 10.0000 mg | INTRAVENOUS | Status: DC | PRN

## 2019-05-23 MED ORDER — THYROID 60 MG PO TABS
60.0000 mg | ORAL_TABLET | Freq: Every day | ORAL | Status: DC
  Administered 2019-05-24 – 2019-05-25 (×2): 60 mg via ORAL
  Filled 2019-05-23 (×3): qty 1

## 2019-05-23 MED ORDER — SENNOSIDES-DOCUSATE SODIUM 8.6-50 MG PO TABS
2.0000 | ORAL_TABLET | ORAL | Status: DC
  Administered 2019-05-23 – 2019-05-24 (×2): 2 via ORAL
  Filled 2019-05-23 (×2): qty 2

## 2019-05-23 MED ORDER — TERBUTALINE SULFATE 1 MG/ML IJ SOLN: 0.2500 mg | Freq: Once | INTRAMUSCULAR | Status: DC | PRN

## 2019-05-23 MED ORDER — PHENYLEPHRINE 40 MCG/ML (10ML) SYRINGE FOR IV PUSH (FOR BLOOD PRESSURE SUPPORT)
80.0000 ug | PREFILLED_SYRINGE | INTRAVENOUS | Status: AC | PRN
  Administered 2019-05-23 (×3): 80 ug via INTRAVENOUS

## 2019-05-23 MED ORDER — ACETAMINOPHEN 325 MG PO TABS
650.0000 mg | ORAL_TABLET | ORAL | Status: DC | PRN
  Administered 2019-05-24: 650 mg via ORAL
  Filled 2019-05-23: qty 2

## 2019-05-23 MED ORDER — ONDANSETRON HCL 4 MG PO TABS
4.0000 mg | ORAL_TABLET | ORAL | Status: DC | PRN
  Administered 2019-05-24: 4 mg via ORAL
  Filled 2019-05-23: qty 1

## 2019-05-23 MED ORDER — BENZOCAINE-MENTHOL 20-0.5 % EX AERO
1.0000 "application " | INHALATION_SPRAY | CUTANEOUS | Status: DC | PRN
  Administered 2019-05-24: 1 via TOPICAL
  Filled 2019-05-23: qty 56

## 2019-05-23 MED ORDER — OXYTOCIN BOLUS FROM INFUSION
500.0000 mL | Freq: Once | INTRAVENOUS | Status: AC
  Administered 2019-05-23: 18:00:00 500 mL via INTRAVENOUS

## 2019-05-23 MED ORDER — IBUPROFEN 600 MG PO TABS
600.0000 mg | ORAL_TABLET | Freq: Four times a day (QID) | ORAL | Status: DC
  Administered 2019-05-23 – 2019-05-25 (×6): 600 mg via ORAL
  Filled 2019-05-23 (×7): qty 1

## 2019-05-23 MED ORDER — BUTORPHANOL TARTRATE 1 MG/ML IJ SOLN: 1.0000 mg | INTRAMUSCULAR | Status: DC | PRN

## 2019-05-23 MED ORDER — SIMETHICONE 80 MG PO CHEW: 80.0000 mg | CHEWABLE_TABLET | ORAL | Status: DC | PRN

## 2019-05-23 MED ORDER — WITCH HAZEL-GLYCERIN EX PADS: 1.0000 "application " | MEDICATED_PAD | CUTANEOUS | Status: DC | PRN

## 2019-05-23 MED ORDER — PRENATAL MULTIVITAMIN CH
1.0000 | ORAL_TABLET | Freq: Every day | ORAL | Status: DC
  Administered 2019-05-24: 1 via ORAL
  Filled 2019-05-23 (×2): qty 1

## 2019-05-23 MED ORDER — DIPHENHYDRAMINE HCL 50 MG/ML IJ SOLN: 12.5000 mg | INTRAMUSCULAR | Status: DC | PRN

## 2019-05-23 MED ORDER — LACTATED RINGERS IV SOLN: 500.0000 mL | INTRAVENOUS | Status: DC | PRN

## 2019-05-23 MED ORDER — OXYCODONE HCL 5 MG PO TABS: 10.0000 mg | ORAL_TABLET | ORAL | Status: DC | PRN

## 2019-05-23 MED ORDER — DOCUSATE SODIUM 100 MG PO CAPS
100.0000 mg | ORAL_CAPSULE | Freq: Two times a day (BID) | ORAL | Status: DC
  Administered 2019-05-24 – 2019-05-25 (×3): 100 mg via ORAL
  Filled 2019-05-23 (×3): qty 1

## 2019-05-23 MED ORDER — TETANUS-DIPHTH-ACELL PERTUSSIS 5-2.5-18.5 LF-MCG/0.5 IM SUSP: 0.5000 mL | Freq: Once | INTRAMUSCULAR | Status: DC

## 2019-05-23 MED ORDER — ACETAMINOPHEN 325 MG PO TABS: 650.0000 mg | ORAL_TABLET | ORAL | Status: DC | PRN

## 2019-05-23 MED ORDER — FLEET ENEMA 7-19 GM/118ML RE ENEM: 1.0000 | ENEMA | Freq: Every day | RECTAL | Status: DC | PRN

## 2019-05-23 MED ORDER — DIPHENHYDRAMINE HCL 25 MG PO CAPS: 25.0000 mg | ORAL_CAPSULE | Freq: Four times a day (QID) | ORAL | Status: DC | PRN

## 2019-05-23 MED ORDER — PHENYLEPHRINE 40 MCG/ML (10ML) SYRINGE FOR IV PUSH (FOR BLOOD PRESSURE SUPPORT)
80.0000 ug | PREFILLED_SYRINGE | INTRAVENOUS | Status: DC | PRN
  Filled 2019-05-23 (×2): qty 10

## 2019-05-23 MED ORDER — ZOLPIDEM TARTRATE 5 MG PO TABS: 5.0000 mg | ORAL_TABLET | Freq: Every evening | ORAL | Status: DC | PRN

## 2019-05-23 MED ORDER — LACTATED RINGERS IV SOLN
500.0000 mL | Freq: Once | INTRAVENOUS | Status: AC
  Administered 2019-05-23: 16:00:00 500 mL via INTRAVENOUS

## 2019-05-23 MED ORDER — FENTANYL-BUPIVACAINE-NACL 0.5-0.125-0.9 MG/250ML-% EP SOLN
12.0000 mL/h | EPIDURAL | Status: DC | PRN
  Filled 2019-05-23: qty 250

## 2019-05-23 MED ORDER — LACTATED RINGERS IV SOLN
INTRAVENOUS | Status: DC
  Administered 2019-05-23 (×2): via INTRAVENOUS

## 2019-05-23 MED ORDER — ONDANSETRON HCL 4 MG/2ML IJ SOLN
4.0000 mg | Freq: Four times a day (QID) | INTRAMUSCULAR | Status: DC | PRN
  Filled 2019-05-23: qty 2

## 2019-05-23 MED ORDER — OXYTOCIN 40 UNITS IN NORMAL SALINE INFUSION - SIMPLE MED
1.0000 m[IU]/min | INTRAVENOUS | Status: DC
  Administered 2019-05-23: 2 m[IU]/min via INTRAVENOUS
  Filled 2019-05-23: qty 1000

## 2019-05-23 MED ORDER — OXYCODONE-ACETAMINOPHEN 5-325 MG PO TABS: 2.0000 | ORAL_TABLET | ORAL | Status: DC | PRN

## 2019-05-23 MED ORDER — COCONUT OIL OIL: 1.0000 "application " | TOPICAL_OIL | Status: DC | PRN

## 2019-05-23 NOTE — H&P (Signed)
Lori Campbell is a 28 y.o. female presenting for rupture of membranes at 2300 on 12/13. +FM, denies regular contractions. Stated in MAU she had scant VB, found to be 3cm and ruptured in MAU  [facility] c/b hypothyroidism on armour thyroid followed by end, depression on no meds, MTHFR mutation (baby ASA for pregnanyc)  Having a boy, GBS neg.   OB History    Gravida  1   Para  0   Term  0   Preterm  0   AB  0   Living  0     SAB  0   TAB  0   Ectopic  0   Multiple  0   Live Births  0          Past Medical History:  Diagnosis Date  . Anxiety    does not take meds  . Chlamydia psittaci pneumonia 2017  . Contact with and (suspected) exposure to mold (toxic)   . Depression    does not take meds  . Dysmenorrhea   . Hypothyroidism   . Migraines   . MTHFR gene mutation ([facility])   . Thyroid disease    Past Surgical History:  Procedure Laterality Date  . ABDOMINAL SURGERY     hernia  . HERNIA REPAIR    . WISDOM TOOTH EXTRACTION     Family History: family history is not on file. Social History:  reports that she has never smoked. She has never used smokeless tobacco. She reports previous alcohol use of about 2.0 - 4.0 standard drinks of alcohol per week. She reports that she does not use drugs.     Maternal Diabetes: No 1hr 119 Genetic Screening: Normal Maternal Ultrasounds/Referrals: Normal Fetal Ultrasounds or other Referrals:  None Maternal Substance Abuse:  No Significant Maternal Medications:  Meds include: Other: Armour Significant Maternal Lab Results:  Group B Strep negative Other Comments:  None  Review of Systems  Constitutional: Negative for chills and fever.  Respiratory: Negative for shortness of breath.   Cardiovascular: Negative for chest pain, palpitations and leg swelling.  Gastrointestinal: Negative for abdominal pain, nausea and vomiting.  Neurological: Negative for dizziness, weakness and headaches.  Psychiatric/Behavioral: Negative for  suicidal ideas.   Maternal Medical History:  Reason for admission: Rupture of membranes.  Nausea.  Contractions: Onset was 6-12 hours ago.   Frequency: rare.   Perceived severity is mild.    Fetal activity: Perceived fetal activity is normal.    Prenatal Complications - Diabetes: none.    Dilation: 2.5 Effacement (%): 50 Station: -3 Exam by:: Dr. Reina Fuse  Blood pressure 115/72, pulse 87, temperature 98.2 F (36.8 C), temperature source Oral, resp. rate 18, height 5\' 4"  (1.626 m), weight 86.2 kg, last menstrual period 08/25/2018. Exam Physical Exam  Constitutional: She is oriented to person, place, and time. She appears well-developed and well-nourished. No distress.  HENT:  Head: Normocephalic and atraumatic.  Eyes: Pupils are equal, round, and reactive to light.  Cardiovascular: Normal rate and regular rhythm. Exam reveals no gallop.  No murmur heard. GI: There is no abdominal tenderness. There is no rebound and no guarding.  Genitourinary:    Vagina and uterus normal.   Musculoskeletal:        General: Normal range of motion.     Cervical back: Normal range of motion and neck supple.  Neurological: She is alert and oriented to person, place, and time.  Skin: Skin is warm and dry.    Prenatal labs:  ABO, Rh: --/--/O POS, O POS Performed at [facility], [place] [address] [place], [place] [address]  [phone]) Antibody: NEG (12/14 0245) Rubella: Immune (05/20 0000) RPR:    HBsAg: Negative (05/20 0000)  HIV: Non-reactive (05/20 0000)  GBS: Negative/-- (12/03 0000)   Category 1 tracing with baseline 135, mod var, + accels, no decels TOco with irritability only CE 2-3/50/-3 @ 0800 by myself  Assessment/Plan: This is a 28yo G1P0 @ 38 5/7 by LMP c/w 1st trim scan admitted to L&D with PROM  Discussion about need to start pitocin at this time given no cervical change since admission and reported rupture time 10hrs ago. At patient's request, reviewed indications  per ACOG for PLTCS (very remote at this time). VSS. Plan to initate pitocin at this time (pt had initially declined pitocin in her birth plan). Allows for pitocin after placental delivery.     Lori Campbell 05/23/2019, 8:07 AM

## 2019-05-23 NOTE — Progress Notes (Addendum)
Presented for bedside examination. Patient sitting up on incline, baby boy latching well. Just moved to postpartum bed. NO circumcision for baby boy. Patient feels less fatigued  BP 103/79   Pulse (!) 101   Temp 97.9 F (36.6 C) (Oral)   Resp 18   Ht 5\' 4"  (1.626 m)   Wt 86.2 kg   LMP 08/25/2018   Breastfeeding Unknown   BMI 32.61 kg/m   Gen: NAD< no pallor, baby boy on chest CV: HR 90s-100s, but regular rhythm. CTAB GU: suture intact, Foley in place, swelling appreciated c/w labor. No ecchymoses  UOP: 450cc/2hrs 11.7/33.7 > 9.9/28.7  Plan to keep Foley in overnight. If patient non-orthostatic or ambulates without issue, may D/C Foley at that time.

## 2019-05-23 NOTE — MAU Note (Signed)
Contractions have been consistent but states she had a gush when she stepped out of the shower around 2230-has had several more gushes since then and lost her mucous plug.  Also reports bright red bleeding since then (a few teaspoons each time she goes to the restroom).  Endorses + FM.

## 2019-05-23 NOTE — MAU Note (Signed)
[place] for patient to have saline lock IV until starting pitocin per Dr. Willis Modena.

## 2019-05-23 NOTE — Anesthesia Procedure Notes (Signed)
Epidural Patient location during procedure: OB Start time: 05/23/2019 4:45 PM End time: 05/23/2019 4:59 PM  Staffing Anesthesiologist: Lynda Rainwater, MD Performed: anesthesiologist   Preanesthetic Checklist Completed: patient identified, IV checked, site marked, risks and benefits discussed, surgical consent, monitors and equipment checked, pre-op evaluation and timeout performed  Epidural Patient position: sitting Prep: ChloraPrep Patient monitoring: heart rate, cardiac monitor, continuous pulse ox and blood pressure Approach: midline Location: L2-L3 Injection technique: LOR saline  Needle:  Needle type: Tuohy  Needle gauge: 17 G Needle length: 9 cm Needle insertion depth: 4 cm Catheter type: closed end flexible Catheter size: 20 Guage Catheter at skin depth: 8 cm Test dose: negative  Assessment Events: blood not aspirated, injection not painful, no injection resistance, no paresthesia and negative IV test  Additional Notes Reason for block:procedure for pain

## 2019-05-23 NOTE — Anesthesia Preprocedure Evaluation (Signed)

## 2019-05-23 NOTE — Progress Notes (Signed)
Labor Note  S: more painful contractions  O: BP (!) 106/58   Pulse 76   Temp 98.3 F (36.8 C) (Oral)   Resp 20   Ht 5\' 4"  (1.626 m)   Wt 86.2 kg   LMP 08/25/2018   BMI 32.61 kg/m  CE: Forebag palpate,d AROM clear @ 1245 3/50/-2, soft and more midposition FHR: Baseline 135, +accels, -decels, modvariability TOCO 2-3, pitocin at 54mU/min  A/P: This is a 28 y.o. G1P0000 at [redacted]w[redacted]d  admitted with PROM. [facility] c/b hypothryoidism, depression no meds, MTHFR mutation. GBS neg, baby boy FWB: Cat 1 tracing MWB: More uncomfortable s/p forebag AROM Labor course: latent labor, continue pitocin  Anticipate SVD

## 2019-05-23 NOTE — Progress Notes (Signed)
Battery died in telemetry monitors.  Switched to wired monitors.  No wired monitor in room.  Called tech to get a wired efm

## 2019-05-23 NOTE — Progress Notes (Signed)
CODE HEMORRHAGE CALLED

## 2019-05-23 NOTE — MAU Provider Note (Signed)
Chief Complaint:  Contractions   First Provider Initiated Contact with Patient 05/23/19 0217     HPI: Lori Campbell is a 28 y.o. G1P0000 at 70w5dwho presents to maternity admissions reporting irregular contractions with some small gushes of fluid.  Has seen blood when voiding.  . She reports good fetal movement, denies LOF, vaginal bleeding, vaginal itching/burning, urinary symptoms, h/a, dizziness, n/v, diarrhea, constipation or fever/chills.  She denies headache, visual changes or RUQ abdominal pain.  Vaginal Discharge The patient's primary symptoms include pelvic pain, vaginal bleeding and vaginal discharge. The patient's pertinent negatives include no genital itching, genital lesions or genital odor. This is a new problem. The current episode started today. The problem occurs intermittently. The problem has been unchanged. The pain is mild. Associated symptoms include abdominal pain. Pertinent negatives include no constipation, diarrhea, discolored urine, fever, nausea or vomiting. The vaginal discharge was clear and bloody. The vaginal bleeding is spotting. She has not been passing clots. She has not been passing tissue. Nothing aggravates the symptoms. She has tried nothing for the symptoms.    RN Note: Contractions have been consistent but states she had a gush when she stepped out of the shower around 2230-has had several more gushes since then and lost her mucous plug.  Also reports bright red bleeding since then (a few teaspoons each time she goes to the restroom).  Endorses + FM.    Past Medical History: Past Medical History:  Diagnosis Date  . Anxiety    does not take meds  . Chlamydia psittaci pneumonia 2017  . Contact with and (suspected) exposure to mold (toxic)   . Depression    does not take meds  . Dysmenorrhea   . Hypothyroidism   . Migraines   . MTHFR gene mutation ([place])   . Thyroid disease     Past obstetric history: OB History  Gravida Para Term Preterm AB  Living  1 0 0 0 0 0  SAB TAB Ectopic Multiple Live Births  0 0 0 0 0    # Outcome Date GA Lbr Len/2nd Weight Sex Delivery Anes PTL Lv  1 Current             Past Surgical History: Past Surgical History:  Procedure Laterality Date  . ABDOMINAL SURGERY     hernia  . HERNIA REPAIR    . WISDOM TOOTH EXTRACTION      Family History: No family history on file.  Social History: Social History   Tobacco Use  . Smoking status: Never Smoker  . Smokeless tobacco: Never Used  Substance Use Topics  . Alcohol use: Not Currently    Alcohol/week: 2.0 - 4.0 standard drinks    Types: 2 - 4 Standard drinks or equivalent per week  . Drug use: Never    Allergies:  Allergies  Allergen Reactions  . Lactose Intolerance (Gi)     Meds:  Medications Prior to Admission  Medication Sig Dispense Refill Last Dose  . Ascorbic Acid (VITAMIN C) 1000 MG tablet Take 2,000 mg by mouth daily.   05/22/2019 at Unknown time  . CHOLINE PO Take by mouth.   05/22/2019 at Unknown time  . Docosahexaenoic Acid (DHA COMPLETE PO) Take by mouth.   05/22/2019 at Unknown time  . Lactobacillus (PROBIOTIC ACIDOPHILUS PO) Take by mouth.   05/22/2019 at Unknown time  . MAGNESIUM MALATE PO Take by mouth.   05/22/2019 at Unknown time  . Prenatal Vit-Fe Fumarate-FA (PRENATAL VITAMIN PO) Take by mouth.  05/22/2019 at Unknown time  . thyroid (ARMOUR) 30 MG tablet Take 60 mg by mouth daily before breakfast.    05/22/2019 at Unknown time  . Loratadine (CLARITIN PO) Take by mouth.     Marland Kitchen UNABLE TO FIND Methyl-8 complex vitamin 1 lozenge daily       I have reviewed patient's Past Medical Hx, Surgical Hx, Family Hx, Social Hx, medications and allergies.   ROS:  Review of Systems  Constitutional: Negative for fever.  Gastrointestinal: Positive for abdominal pain. Negative for constipation, diarrhea, nausea and vomiting.  Genitourinary: Positive for pelvic pain, vaginal bleeding and vaginal discharge.   Other systems  negative  Physical Exam   Patient Vitals for the past 24 hrs:  BP Temp Temp src Pulse Resp Weight  05/23/19 0126 121/74 98 F (36.7 C) Oral 91 19 88.1 kg   Constitutional: Well-developed, well-nourished female in no acute distress.  Cardiovascular: normal rate and rhythm Respiratory: normal effort, clear to auscultation bilaterally GI: Abd soft, non-tender, gravid appropriate for gestational age.   No rebound or guarding. MS: Extremities nontender, no edema, normal ROM Neurologic: Alert and oriented x 4.  GU: Neg CVAT.  PELVIC EXAM: Cervix pink, visually closed, without lesion, scant clear discharge, vaginal walls and external genitalia normal   + Ferning   Dilation: 3 Effacement (%): Thick Station: -2 Presentation: Vertex Exam by:: Suezanne Jacquet, RN  FHT:  Baseline 140 , moderate variability, accelerations present, no decelerations Contractions: Irregular    Labs: Results for orders placed or performed during the hospital encounter of 05/23/19 (from the past 24 hour(s))  Fern Test     Status: Abnormal   Collection Time: 05/23/19  2:22 AM  Result Value Ref Range   POCT Fern Test Positive = ruptured amniotic membanes   CBC     Status: Abnormal   Collection Time: 05/23/19  2:35 AM  Result Value Ref Range   WBC 12.7 (H) 4.0 - 10.5 K/uL   RBC 3.76 (L) 3.87 - 5.11 MIL/uL   Hemoglobin 11.7 (L) 12.0 - 15.0 g/dL   HCT 79.8 (L) 92.1 - 19.4 %   MCV 89.6 80.0 - 100.0 fL   MCH 31.1 26.0 - 34.0 pg   MCHC 34.7 30.0 - 36.0 g/dL   RDW 17.4 08.1 - 44.8 %   Platelets 151 150 - 400 K/uL   nRBC 0.0 0.0 - 0.2 %  Type and screen [facility]     Status: None   Collection Time: 05/23/19  2:45 AM  Result Value Ref Range   ABO/RH(D) O POS    Antibody Screen NEG    Sample Expiration      05/26/2019,2359 Performed at [facility], [address] [place], [place] [address]   ABO/Rh     Status: None (Preliminary result)   Collection Time: 05/23/19  2:45 AM  Result  Value Ref Range   ABO/RH(D)      O POS Performed at [facility], [address] [place], [place] [address]      Imaging:  No results found.  MAU Course/MDM: I have ordered labs and reviewed results.  NST reviewed, reactive with irregular contractions Dr Jackelyn Knife consulted by RN Treatments in MAU included EFM, SSE.    Assessment: Single intrauterine pregnancy at [redacted]w[redacted]d PROM at term Latent labor  Plan: Admit to Labor and Delivery Routine orders MD to follow  Wynelle Bourgeois CNM, MSN Certified Nurse-Midwife 05/23/2019 2:17 AM

## 2019-05-23 NOTE — Progress Notes (Signed)
Pt up in bathroom cleaning up from [phone]

## 2019-05-24 ENCOUNTER — Encounter (HOSPITAL_COMMUNITY): Payer: Self-pay | Admitting: Obstetrics and Gynecology

## 2019-05-24 LAB — CBC
HCT: 20.8 % — ABNORMAL LOW (ref 36.0–46.0)
Hemoglobin: 7.2 g/dL — ABNORMAL LOW (ref 12.0–15.0)
MCH: 30.6 pg (ref 26.0–34.0)
MCHC: 34.6 g/dL (ref 30.0–36.0)
MCV: 88.5 fL (ref 80.0–100.0)
Platelets: 141 10*3/uL — ABNORMAL LOW (ref 150–400)
RBC: 2.35 MIL/uL — ABNORMAL LOW (ref 3.87–5.11)
RDW: 12.6 % (ref 11.5–15.5)
WBC: 14.3 10*3/uL — ABNORMAL HIGH (ref 4.0–10.5)
nRBC: 0 % (ref 0.0–0.2)

## 2019-05-24 LAB — PREPARE RBC (CROSSMATCH)

## 2019-05-24 MED ORDER — SODIUM CHLORIDE 0.9% IV SOLUTION: Freq: Once | INTRAVENOUS | Status: AC

## 2019-05-24 MED ORDER — ACETAMINOPHEN 325 MG PO TABS
650.0000 mg | ORAL_TABLET | Freq: Once | ORAL | Status: AC
  Administered 2019-05-24: 650 mg via ORAL
  Filled 2019-05-24: qty 2

## 2019-05-24 NOTE — Anesthesia Postprocedure Evaluation (Signed)
Anesthesia Post Note  Patient: Lori Campbell  Procedure(s) Performed: AN AD HOC LABOR EPIDURAL     Patient location during evaluation: Mother Baby Anesthesia Type: Epidural Level of consciousness: awake and alert Pain management: pain level controlled Vital Signs Assessment: post-procedure vital signs reviewed and stable Respiratory status: spontaneous breathing, nonlabored ventilation and respiratory function stable Cardiovascular status: stable Postop Assessment: no headache, no backache, epidural receding, no apparent nausea or vomiting, patient able to bend at knees, adequate PO intake and able to ambulate Anesthetic complications: no    Last Vitals:  Vitals:   05/24/19 1129 05/24/19 1326  BP: 105/75 106/64  Pulse: 97 93  Resp: 20 18  Temp: 36.8 C 37 C  SpO2: 99% 100%    Last Pain:  Vitals:   05/24/19 1326  TempSrc: Oral  PainSc:    Pain Goal:                   [employer]

## 2019-05-24 NOTE — Lactation Note (Addendum)
This note was copied from a baby's chart. Lactation Consultation Note:  Infant is now 60 hours old. Mother has been exclusively breastfeeding infant.   Mother reports that infant is feeding well. Mother assist with positioning infant in football hold. Mother reports that she feels slight  Pinching when infant is latched. Mother reports that nipple is round when infant releases the breast.   Observed infants feeding for 20 mins.  Infant was observed with steady pattern of rhythmic suckling and audible swallows. obs that infant has upper lip flanged. Difficulty flanging infant lower jaw for wider gape. Infant has a recessed chin .  Worked on getting good depth.   Mother is getting her second unit of blood after a ebl of 1100 cc. Discussed the use of a DEBP to protect her milk supply. Mother reports that she is not up to pumping at this time. Discussed possible need for pumping with patients staff nurse.  Mother was advised when ready of fells better to have [facility] or staff nurse to set up a DEBP.  Encouraged mother to continue to cue base feed and informed mother of cluster feeding.  Mother reports that she has a Designer, television/film set and a Haka at home.  Encouraged continued STS.    Patient Name: Lori Campbell [id number] Date: 05/24/2019 Reason for consult: Follow-up assessment Type of Endocrine Disorder?: Thyroid   Maternal Data    Feeding Feeding Type: Breast Fed  LATCH Score Latch: Grasps breast easily, tongue down, lips flanged, rhythmical sucking.  Audible Swallowing: Spontaneous and intermittent  Type of Nipple: Everted at rest and after stimulation  Comfort (Breast/Nipple): Soft / non-tender  Hold (Positioning): Assistance needed to correctly position infant at breast and maintain latch.  LATCH Score: 9  Interventions Interventions: Assisted with latch;Skin to skin;Hand express;Breast compression;Adjust position;Support pillows;Position options  Lactation Tools Discussed/Used      Consult Status Consult Status: Follow-up Date: 05/25/19 Follow-up type: In-patient    [name] [facility] 05/24/2019, 3:33 PM

## 2019-05-24 NOTE — Lactation Note (Signed)
This note was copied from a baby's chart. Lactation Consultation Note  Patient Name: Lori Campbell [id number] Date: 05/24/2019 Reason for consult: Follow-up assessment;Mother's request;Primapara;1st time breastfeeding;Maternal endocrine disorder;Early term 37-38.6wks Type of Endocrine Disorder?: Thyroid  [id number] - I followed up with Ms. Buena Irish upon request. When I arrived, she had just finished feeding her son. She states that her baby is feeding well; she hears swallows and notes active jaw movement when he's on the breast. She denies pain with latch. He last fed for about 20 minutes, and she states that he's relaxed after feeding.  Her main concern for this visit was that he tends to tuck in his lower lip when feeding. I provided some tips for how to check his chin while he was latched and how to gently tug the chin while not interfering with his feeding.  We discussed signs of transfer. Ms. Schou states that she is feeling much better after receiving blood. She has hypothyroidism, but she manages it with medication, and she is seeing an endocrinologist for follow up in January.   I discussed pumping in addition to feeding as a way to promote breast milk production, as she has some risk to production due to estimated blood loss. She indicated that she was not ready to pump tonight, but she would be willing to do some pumping tomorrow. I discussed adding in some pumping sessions for stimulation for "insurance" purposes to protect her milk. She verbalized understanding.   Maternal Data Does the patient have breastfeeding experience prior to this delivery?: No   Interventions Interventions: Breast feeding basics reviewed    Consult Status Consult Status: Follow-up Date: 05/25/19 Follow-up type: In-patient    Lenore Manner 05/24/2019, 10:32 PM

## 2019-05-24 NOTE — Progress Notes (Signed)
MOB was referred for history of depression/anxiety.  * Referral screened out by Clinical Social Worker because none of the following criteria appear to apply:  ~ History of anxiety/depression during this pregnancy, or of post-partum depression following prior delivery. Per chart review, patient's anxiety and depression is currently stable on no meds. ~ Diagnosis of anxiety and/or depression within last 3 years.  OR * MOB's symptoms currently being treated with medication and/or therapy.  Please contact the Clinical Social Worker if needs arise, by [facility] request, or if MOB scores greater than 9/yes to question 10 on Edinburgh Postpartum Depression Screen.  Lori Campbell, [place]  [facility] [employer] [phone]

## 2019-05-24 NOTE — Progress Notes (Signed)
Patient very tired. Encouraged patient to order breakfast and take a nap, then we will ambulate and assess to get foley removed.

## 2019-05-24 NOTE — Progress Notes (Signed)
Keep foley catheter intact till after blood transfusion per Dr Marvel Plan.

## 2019-05-24 NOTE — Lactation Note (Signed)
This note was copied from a baby's chart. Lactation Consultation Note  Patient Name: Lori Campbell [id number] Date: 05/24/2019 Reason for consult: Initial assessment;1st time breastfeeding;Maternal endocrine disorder Type of Endocrine Disorder?: Thyroid P1, 5 hour female infant. Mom's hx" hypothyroidism Per mom, she has DEBP at home.  This is infant's 2nd time latching at breast. Mom was breastfeeding infant on right side using cradle but with no pillow support. [place] asked mom to break latch mom was agreeable. Mom latched infant on left side using the football hold position, LC asked mom to tickle infant below nose with breast, wait until tongue down and mouth wide, infant latched chin first, nose and chin touching breast. Swallow were heard by mom and LC and infant breastfed for 17 minutes combined. Mom taught back hand expression and infant was given 4 mls  of colostrum by spoon and appeared content after feeding. Mom had additional 5 mls of colostrum in bullet that she will offer after latching infant to breast at next feeding.  Mom understands to breastfeed infant according to hunger cues, 8 to 12 times within 24 hours and on demand. Parents will continue to do as much STS as possible. Mom knows to call RN or LC if she has any questions, concerns or need assistance with latching infant to breast.  Reviewed Baby & Me book's Breastfeeding Basics.  Mom made aware of O/P services, breastfeeding support groups, community resources, and our phone # for post-discharge questions.   Maternal Data Formula Feeding for Exclusion: No Has patient been taught Hand Expression?: Yes Does the patient have breastfeeding experience prior to this delivery?: No  Feeding Feeding Type: Breast Fed  LATCH Score Latch: Grasps breast easily, tongue down, lips flanged, rhythmical sucking.  Audible Swallowing: Spontaneous and intermittent  Type of Nipple: Everted at rest and after stimulation  Comfort  (Breast/Nipple): Soft / non-tender  Hold (Positioning): Assistance needed to correctly position infant at breast and maintain latch.  LATCH Score: 9  Interventions Interventions: Breast feeding basics reviewed;Breast compression;Assisted with latch;Adjust position;Skin to skin;Support pillows;Breast massage;Position options;Hand express;Expressed milk  Lactation Tools Discussed/Used WIC Program: No   Consult Status Consult Status: Follow-up Date: 05/24/19 Follow-up type: In-patient    Vicente Serene 05/24/2019, 12:02 AM

## 2019-05-24 NOTE — Progress Notes (Signed)
Post Partum Day 1 NSVD/Laceration repair with excessive blood loss  Subjective: Pt reports was able to stand this AM and move around a bit, got a little lightheaded after several minutes. Otherwise pain controlled and working on breastfeeding.  Objective: Blood pressure 99/60, pulse 96, temperature 98 F (36.7 C), temperature source Oral, resp. rate 16, height 5\' 4"  (1.626 m), weight 86.2 kg, last menstrual period 08/25/2018, SpO2 100 %, unknown if currently breastfeeding.  Physical Exam:  General: alert and cooperative Lochia: appropriate Uterine Fundus: firm   Recent Labs    05/23/19 1905 05/24/19 0601  HGB 9.9* 7.2*  HCT 28.7* 20.8*    Assessment/Plan: Pt with acute blood loss anemia from 11.7 to 7.2.  D/w her that if unable to ambulate without lightheadedness this AM we should consider transfusion. D/w her the risks and benefits of transfusion including low risk of transfusion reaction and low risk of infection transmission since blood is well-screened with current protocols.  Pt desires at this time to hold off on transfusion and see how she does after having breakfast and resting a bit.  D/w RN and she will let me know if patient not tolerating ambulation, if does well can pull foley catheter.  Declines circumcision.   LOS: 1 day   Logan Bores 05/24/2019, 9:23 AM

## 2019-05-25 LAB — BPAM RBC
Blood Product Expiration Date: 202101152359
Blood Product Expiration Date: 202101152359
ISSUE DATE / TIME: 202012151339
ISSUE DATE / TIME: 202012151608
Unit Type and Rh: 5100
Unit Type and Rh: 5100

## 2019-05-25 LAB — TYPE AND SCREEN
ABO/RH(D): O POS
Antibody Screen: NEGATIVE
Unit division: 0
Unit division: 0

## 2019-05-25 LAB — CBC
HCT: 24.7 % — ABNORMAL LOW (ref 36.0–46.0)
Hemoglobin: 8.4 g/dL — ABNORMAL LOW (ref 12.0–15.0)
MCH: 31 pg (ref 26.0–34.0)
MCHC: 34 g/dL (ref 30.0–36.0)
MCV: 91.1 fL (ref 80.0–100.0)
Platelets: 138 10*3/uL — ABNORMAL LOW (ref 150–400)
RBC: 2.71 MIL/uL — ABNORMAL LOW (ref 3.87–5.11)
RDW: 13.6 % (ref 11.5–15.5)
WBC: 14.1 10*3/uL — ABNORMAL HIGH (ref 4.0–10.5)
nRBC: 0 % (ref 0.0–0.2)

## 2019-05-25 MED ORDER — IBUPROFEN 600 MG PO TABS: 600.0000 mg | ORAL_TABLET | Freq: Four times a day (QID) | ORAL | 1 refills | Status: AC | PRN

## 2019-05-25 NOTE — Discharge Instructions (Signed)
Call office with any concerns [phone] 

## 2019-05-25 NOTE — Progress Notes (Signed)
Post Partum Day 1 Subjective: up ad lib, voiding, tolerating PO and breastfeeding/bonding well. SHe is fatigued from lack of sleep. She denies lightheadedness, HA or CP. Feels ready for discharge to home today  Objective: Blood pressure 116/68, pulse 81, temperature 98.1 F (36.7 C), temperature source Oral, resp. rate 16, height 5\' 4"  (1.626 m), weight 86.2 kg, last menstrual period 08/25/2018, SpO2 100 %, unknown if currently breastfeeding.  Physical Exam:  General: alert, cooperative and no distress Lochia: appropriate Uterine Fundus: firm Incision: n/a DVT Evaluation: No evidence of DVT seen on physical exam.  Recent Labs    05/24/19 0601 05/25/19 0516  HGB 7.2* 8.4*  HCT 20.8* 24.7*    Assessment/Plan: Discharge home and Breastfeeding  Discharge instructions reviewed   LOS: 2 days   Lauralynn Loeb W Susannah Carbin 05/25/2019, 9:43 AM

## 2019-05-25 NOTE — Discharge Summary (Signed)
OB Discharge Summary     Patient Name: Lori Campbell DOB: Dec 18, 1990 MRN: [id number]  Date of admission: 05/23/2019 Delivering MD: Deliah Boston   Date of discharge: 05/25/2019  Admitting diagnosis: Pregnancy [Z34.90] Intrauterine pregnancy: [redacted]w[redacted]d     Secondary diagnosis:  Active Problems:   Pregnancy  Additional problems: postpartum hemorrhage- transfused     Discharge diagnosis: Term Pregnancy Delivered                                                                                                Post partum procedures:blood transfusion  Augmentation: Pitocin  Complications: [id number]  Hospital course:  Onset of Labor With Vaginal Delivery     28 y.o. yo G1P1001 at [redacted]w[redacted]d was admitted in Latent Labor on 05/23/2019. Patient had an uncomplicated labor course as follows:  Membrane Rupture Time/Date: 10:30 PM ,05/22/2019   Intrapartum Procedures: Episiotomy: Median [2]                                         Lacerations:  Sulcus [9];2nd degree [3];Perineal [11]  Patient had a delivery of a Viable infant. 05/23/2019  Information for the patient's newborn:  Brytani, Voth [[id number]]  Delivery Method: Vaginal, Spontaneous(Filed from Delivery Summary)     Pateint had an uncomplicated postpartum course.  She is ambulating, tolerating a regular diet, passing flatus, and urinating well. Patient is discharged home in stable condition on 05/25/19.   Physical exam  Vitals:   05/24/19 1629 05/24/19 1731 05/24/19 2100 05/25/19 0628  BP: 106/65 110/65 104/63 116/68  Pulse: 92 79 82 81  Resp: 18 18 16 16   Temp: 98.4 F (36.9 C) 98 F (36.7 C) 98.3 F (36.8 C) 98.1 F (36.7 C)  TempSrc: Oral Oral Axillary Oral  SpO2: 99% 100%  100%  Weight:      Height:       General: alert, cooperative and no distress Lochia: appropriate Uterine Fundus: firm Incision: N/A DVT Evaluation: No evidence of DVT seen on physical exam. Labs: Lab Results  Component Value  Date   WBC 14.1 (H) 05/25/2019   HGB 8.4 (L) 05/25/2019   HCT 24.7 (L) 05/25/2019   MCV 91.1 05/25/2019   PLT 138 (L) 05/25/2019   CMP Latest Ref Rng & Units 05/23/2019  Glucose 70 - 99 mg/dL 121(H)  BUN 6 - 20 mg/dL 6  Creatinine 0.44 - 1.00 mg/dL 0.77  Sodium 135 - 145 mmol/L 136  Potassium 3.5 - 5.1 mmol/L 3.8  Chloride 98 - 111 mmol/L 107  CO2 22 - 32 mmol/L 18(L)  Calcium 8.9 - 10.3 mg/dL 8.7(L)  Total Protein 6.5 - 8.1 g/dL 5.4(L)  Total Bilirubin 0.3 - 1.2 mg/dL 0.9  Alkaline Phos 38 - 126 U/L 145(H)  AST 15 - 41 U/L 29  ALT 0 - 44 U/L 20    Discharge instruction: per After Visit Summary and "Baby and Me Booklet".  After visit meds:  Allergies as of 05/25/2019  Reactions   Lactose Intolerance (gi)       Medication List    TAKE these medications   CHOLINE PO Take by mouth.   CLARITIN PO Take by mouth.   DHA COMPLETE PO Take by mouth.   ibuprofen 600 MG tablet Commonly known as: ADVIL Take 1 tablet (600 mg total) by mouth every 6 (six) hours as needed for moderate pain or cramping.   MAGNESIUM MALATE PO Take by mouth.   PRENATAL VITAMIN PO Take by mouth.   PROBIOTIC ACIDOPHILUS PO Take by mouth.   thyroid 30 MG tablet Commonly known as: ARMOUR Take 60 mg by mouth daily before breakfast.   UNABLE TO FIND Methyl-8 complex vitamin 1 lozenge daily   vitamin C 1000 MG tablet Take 2,000 mg by mouth daily.       Diet: routine diet  Activity: Advance as tolerated. Pelvic rest for 6 weeks.   Outpatient follow up:6 weeks Follow up Appt: Future Appointments  Date Time Provider Department Center  06/30/2019 10:30 AM Shamleffer, Konrad Dolores, MD [id number] None   Follow up Visit:No follow-ups on file.  Postpartum contraception: Not Discussed  Newborn Data: Live born female  Birth Weight: 8 lb 12 oz (3969 g) APGAR: 9, 10  Newborn Delivery   Birth date/time: 05/23/2019 18:25:00 Delivery type: Vaginal, Spontaneous      Baby  Feeding: Breast Disposition:home with mother   05/25/2019 Cathrine Muster, DO

## 2019-05-25 NOTE — Lactation Note (Addendum)
This note was copied from a baby's chart. Lactation Consultation Note:  LC was paged to mothers room for assistance . She reports that she has run into a problem. Mother reports that infant has cluster fed all night long. She reports that her nipples are getting very sore. She also reports that she is feeling infant chomp on her nipple. Mother is getting upset and reports that she just wants to go home.  Infant in semi-football position sitting up without any pillow support. Infants lower lip is curled up. LC massaged his jaw and did chin tug with  attempt to adjust infants lower jaw. Mother became very teary and reports that the latch is so painful and that infant is bitting her.   LC assessed infant oral cavity with a glove finger and observed that infant has a short tight lingual frenula.   Assist mother with changing positions and sitting up for better support with using pillows. Mother agreeable and infant placed in cross cradle.  Advised to rotate positions. Infant openned wide and latched with wider gape. Observed a few swallows but mother unable to tolerate latch.    Mother continued to complain of pain. Mother reports shes very tired and wants to go home. Mother began sobbing.   LC showed father how to soothe infant.  Discussed using a DEBP or hand pump to express and give infant extra ebm to soothe him so she could rest. Mother began to sob and reports that she doesn't want infant to have a bottle.   LC explained that there are several different methods to supplement infant with. Father of infant was given spoon , foley cup , curved tip syringe and a #5 fr feeding tube with instructions on the use of all.   Discussed briefly about treatment and prevention of engorgement.  Suggested that mother take a break while father does STS so she can nap.   Discussed that cluster feeding will continue for next several days. Mother ask when her milk will come . LC informed of a normal vaginal  del , milk could come as early as day 3. Discussed mothers blood loss again and informed her that it could be delayed and that post pumping safe guards her milk supply.   Mother continues to decline pumping at this time. She reports that it is too much work. Mother was given a hand pump with instructions.  Suggested that after feeding infant that LC would recommend hand expression and spoon feeding any amt of ebm.  Parents were given supplemental amt handout at fathers request.   Support and encouragement given to mother on all her progress with breastfeeding and attempt to comfort her telling her that rest with make her feel so much better.    [place] was informed of all LC services at [facility].  Patient Name: Lori Campbell ZYSAY'T Date: 05/25/2019 Reason for consult: Follow-up assessment Type of Endocrine Disorder?: Thyroid   Maternal Data    Feeding Feeding Type: Breast Fed  LATCH Score Latch: Grasps breast easily, tongue down, lips flanged, rhythmical sucking.  Audible Swallowing: Spontaneous and intermittent  Type of Nipple: Everted at rest and after stimulation  Comfort (Breast/Nipple): Filling, red/small blisters or bruises, mild/mod discomfort(mother describes infant chomping the nipple)  Hold (Positioning): Assistance needed to correctly position infant at breast and maintain latch.  LATCH Score: 8  Interventions Interventions: Assisted with latch  Lactation Tools Discussed/Used Pump Review: Setup, frequency, and cleaning;Milk Storage Initiated by:: Stevan Born RN,IBCLC Date initiated:: 05/25/19  Consult Status Consult Status: PRN    Darla Lesches 05/25/2019, 8:13 AM

## 2019-05-26 ENCOUNTER — Observation Stay (HOSPITAL_COMMUNITY)
Admission: AD | Admit: 2019-05-26 | Discharge: 2019-05-27 | Disposition: A | Discharge status: Home / Self Care | Payer: 59 | Attending: Obstetrics and Gynecology | Admitting: Obstetrics and Gynecology

## 2019-05-26 ENCOUNTER — Encounter (HOSPITAL_COMMUNITY): Payer: Self-pay | Admitting: Obstetrics and Gynecology

## 2019-05-26 ENCOUNTER — Other Ambulatory Visit: Payer: Self-pay

## 2019-05-26 DIAGNOSIS — R42 Dizziness and giddiness: Secondary | ICD-10-CM | POA: Insufficient documentation

## 2019-05-26 DIAGNOSIS — R5383 Other fatigue: Secondary | ICD-10-CM | POA: Diagnosis not present

## 2019-05-26 DIAGNOSIS — O99893 Other specified diseases and conditions complicating puerperium: Secondary | ICD-10-CM | POA: Diagnosis present

## 2019-05-26 DIAGNOSIS — O9089 Other complications of the puerperium, not elsewhere classified: Secondary | ICD-10-CM | POA: Diagnosis not present

## 2019-05-26 DIAGNOSIS — R110 Nausea: Secondary | ICD-10-CM | POA: Diagnosis not present

## 2019-05-26 DIAGNOSIS — D649 Anemia, unspecified: Secondary | ICD-10-CM | POA: Insufficient documentation

## 2019-05-26 LAB — SAMPLE TO BLOOD BANK

## 2019-05-26 LAB — CBC
HCT: 25.1 % — ABNORMAL LOW (ref 36.0–46.0)
Hemoglobin: 8.7 g/dL — ABNORMAL LOW (ref 12.0–15.0)
MCH: 30.9 pg (ref 26.0–34.0)
MCHC: 34.7 g/dL (ref 30.0–36.0)
MCV: 89 fL (ref 80.0–100.0)
Platelets: 201 10*3/uL (ref 150–400)
RBC: 2.82 MIL/uL — ABNORMAL LOW (ref 3.87–5.11)
RDW: 13.2 % (ref 11.5–15.5)
WBC: 12.3 10*3/uL — ABNORMAL HIGH (ref 4.0–10.5)
nRBC: 0 % (ref 0.0–0.2)

## 2019-05-26 MED ORDER — ONDANSETRON HCL 4 MG/2ML IJ SOLN
4.0000 mg | Freq: Once | INTRAMUSCULAR | Status: AC
  Administered 2019-05-26: 4 mg via INTRAVENOUS
  Filled 2019-05-26: qty 2

## 2019-05-26 MED ORDER — LACTATED RINGERS IV SOLN: INTRAVENOUS | Status: DC

## 2019-05-26 NOTE — MAU Note (Signed)
Vag delivery Monday night. Had pp hemorrhage and received 2units blood on Tues. [place] home yesterday. Feeling faint, nauseated, and very fatigued. Vag bleeding is minimal

## 2019-05-26 NOTE — MAU Provider Note (Signed)
Chief Complaint:  Fatigue   First Provider Initiated Contact with Patient 05/26/19 2329      HPI: Lori Campbell is a 28 y.o. G1P1001 who presents to maternity admissions reporting dizziness, weakness, nausea and fatigue.  States cannot sleep, "jerks awake" when tries to fall asleep.  Admits to being anxious.  Requesting fereheme.  Had 2 units of blood following a PPH while in hospital postpartum.  Breastfeeding.  She reports vaginal bleeding, but no urinary symptoms, h/a, dizziness, n/v, or fever/chills.    Dizziness This is a new problem. The current episode started yesterday. The problem occurs intermittently. The problem has been unchanged. Associated symptoms include fatigue, nausea and weakness. Pertinent negatives include no abdominal pain, chills, congestion, coughing, diaphoresis, fever, headaches, myalgias, visual change or vomiting. The symptoms are aggravated by exertion and standing. She has tried eating, drinking and relaxation for the symptoms. The treatment provided no relief.   RN Note: Vag delivery Monday night. Had pp hemorrhage and received 2units blood on Tues. [place] home yesterday. Feeling faint, nauseated, and very fatigued. Vag bleeding is minimal  Past Medical History: Past Medical History:  Diagnosis Date  . Anxiety    does not take meds  . Chlamydia psittaci pneumonia 2017  . Contact with and (suspected) exposure to mold (toxic)   . Depression    does not take meds  . Dysmenorrhea   . Hypothyroidism   . Migraines   . MTHFR gene mutation ([place])   . Thyroid disease     Past obstetric history: OB History  Gravida Para Term Preterm AB Living  1 1 1  0 0 1  SAB TAB Ectopic Multiple Live Births  0 0 0 0 1    # Outcome Date GA Lbr Len/2nd Weight Sex Delivery Anes PTL Lv  1 Term 05/23/19 [redacted]w[redacted]d 19:25 / 00:30 3969 g M Vag-Spont Gen  LIV    Past Surgical History: Past Surgical History:  Procedure Laterality Date  . ABDOMINAL SURGERY     hernia  . HERNIA  REPAIR    . WISDOM TOOTH EXTRACTION      Family History: History reviewed. No pertinent family history.  Social History: Social History   Tobacco Use  . Smoking status: Never Smoker  . Smokeless tobacco: Never Used  Substance Use Topics  . Alcohol use: Not Currently    Alcohol/week: 2.0 - 4.0 standard drinks    Types: 2 - 4 Standard drinks or equivalent per week  . Drug use: Never    Allergies:  Allergies  Allergen Reactions  . Lactose Intolerance (Gi)     Meds:  Medications Prior to Admission  Medication Sig Dispense Refill Last Dose  . Ascorbic Acid (VITAMIN C) 1000 MG tablet Take 2,000 mg by mouth daily.   05/26/2019 at Unknown time  . CHOLINE PO Take by mouth.   05/26/2019 at Unknown time  . Docosahexaenoic Acid (DHA COMPLETE PO) Take by mouth.   05/26/2019 at Unknown time  . docusate sodium (COLACE) 100 MG capsule Take 100 mg by mouth 2 (two) times daily.   05/26/2019 at Unknown time  . ibuprofen (ADVIL) 600 MG tablet Take 1 tablet (600 mg total) by mouth every 6 (six) hours as needed for moderate pain or cramping. 40 tablet 1 05/26/2019 at 1900  . Lactobacillus (PROBIOTIC ACIDOPHILUS PO) Take by mouth.   05/26/2019 at Unknown time  . Loratadine (CLARITIN PO) Take by mouth.   05/26/2019 at Unknown time  . MAGNESIUM MALATE PO Take by  mouth.   05/26/2019 at Unknown time  . Prenatal Vit-Fe Fumarate-FA (PRENATAL VITAMIN PO) Take by mouth.   05/26/2019 at Unknown time  . thyroid (ARMOUR) 30 MG tablet Take 60 mg by mouth daily before breakfast.    05/26/2019 at Unknown time  . UNABLE TO FIND Methyl-8 complex vitamin 1 lozenge daily   05/26/2019 at Unknown time    I have reviewed patient's Past Medical Hx, Surgical Hx, Family Hx, Social Hx, medications and allergies.  ROS:  Review of Systems  Constitutional: Positive for fatigue. Negative for chills, diaphoresis and fever.  HENT: Negative for congestion.   Respiratory: Negative for cough.   Gastrointestinal: Positive  for nausea. Negative for abdominal pain and vomiting.  Musculoskeletal: Negative for myalgias.  Neurological: Positive for dizziness, weakness and light-headedness. Negative for syncope and headaches.   Other systems negative    Physical Exam   Patient Vitals for the past 24 hrs:  BP Temp Pulse Resp SpO2 Height Weight  05/26/19 2314 122/75 -- 87 -- 100 % -- --  05/26/19 2307 -- -- -- -- 100 % -- --  05/26/19 2305 117/69 98.1 F (36.7 C) 88 18 -- 5\' 4"  (1.626 m) 81.2 kg   122/66      99   Lying  127/76     105  Sitting 127/71     113  Standing   Constitutional: Well-developed, well-nourished female in no acute distress.  Cardiovascular: normal rate and rhythm, no ectopy audible, S1 & S2 heard, no murmur Respiratory: normal effort, no distress. Lungs CTAB with no wheezes or crackles GI: Abd soft, non-tender.  Nondistended.  No rebound, No guarding.  Bowel Sounds audible  MS: Extremities nontender, no edema, normal ROM Neurologic: Alert and oriented x 4.   Grossly nonfocal. GU: Neg CVAT. Skin:  Warm and Dry Psych:  Affect appropriate.  PELVIC EXAM: Lochia small to moderate   Labs: Results for orders placed or performed during the hospital encounter of 05/26/19 (from the past 24 hour(s))  CBC     Status: Abnormal   Collection Time: 05/26/19 11:28 PM  Result Value Ref Range   WBC 12.3 (H) 4.0 - 10.5 K/uL   RBC 2.82 (L) 3.87 - 5.11 MIL/uL   Hemoglobin 8.7 (L) 12.0 - 15.0 g/dL   HCT 16.125.1 (L) 09.636.0 - 04.546.0 %   MCV 89.0 80.0 - 100.0 fL   MCH 30.9 26.0 - 34.0 pg   MCHC 34.7 30.0 - 36.0 g/dL   RDW 40.913.2 81.111.5 - 91.415.5 %   Platelets 201 150 - 400 K/uL   nRBC 0.0 0.0 - 0.2 %  Sample to Blood Bank     Status: None   Collection Time: 05/26/19 11:28 PM  Result Value Ref Range   Blood Bank Specimen SAMPLE AVAILABLE FOR TESTING    Sample Expiration      05/27/2019,2359 Performed at [facility], [address] [place], [place] [address]   Type and screen     Status: None    Collection Time: 05/26/19 11:28 PM  Result Value Ref Range   ABO/RH(D) O POS    Antibody Screen NEG    Sample Expiration      05/29/2019,2359 Performed at [facility], [address] [place], [place] [address]   Prepare RBC     Status: None   Collection Time: 05/27/19 12:41 AM  Result Value Ref Range   Order Confirmation      ORDER PROCESSED BY BLOOD BANK  Performed at [facility], [address] [place], [place] [address]     --/--/O POS, [place] POS Performed at [facility], [address] [place], [place] [address]  [phone])  Imaging:  No results found.  MAU Course/MDM: I have ordered labs as follows:  See above.  Hemoglobin is stable since discharge but pt is symptomatic Imaging ordered: none Results reviewed.   Consult Dr Jolayne Panther and Dr Jackelyn Knife.  They recommend transfusing one unit of blood then reassessing..   Treatments in MAU included IV fluids, Zofran.  These did not resolve her dizziness or nausea.   Pt stable at time of transfer  Assessment: Postpartum Day #4 S/P PPH and sulcus laceration S/P blood transfusion Symptomatic Anemia  Plan: Admit to [facility] Transfuse one unit of Packed Red Blood Cells IV hydration Tylenol and Benadryl premedication MD to follow in AM   Wynelle Bourgeois CNM, MSN Certified Nurse-Midwife 05/26/2019 11:49 PM

## 2019-05-27 ENCOUNTER — Encounter (HOSPITAL_COMMUNITY): Payer: Self-pay | Admitting: Obstetrics and Gynecology

## 2019-05-27 DIAGNOSIS — R110 Nausea: Secondary | ICD-10-CM

## 2019-05-27 DIAGNOSIS — R5383 Other fatigue: Secondary | ICD-10-CM

## 2019-05-27 DIAGNOSIS — D649 Anemia, unspecified: Secondary | ICD-10-CM | POA: Diagnosis present

## 2019-05-27 DIAGNOSIS — O9089 Other complications of the puerperium, not elsewhere classified: Secondary | ICD-10-CM

## 2019-05-27 LAB — COMPREHENSIVE METABOLIC PANEL
ALT: 26 U/L (ref 0–44)
AST: 25 U/L (ref 15–41)
Albumin: 2.5 g/dL — ABNORMAL LOW (ref 3.5–5.0)
Alkaline Phosphatase: 99 U/L (ref 38–126)
Anion gap: 8 (ref 5–15)
BUN: 6 mg/dL (ref 6–20)
CO2: 24 mmol/L (ref 22–32)
Calcium: 8.4 mg/dL — ABNORMAL LOW (ref 8.9–10.3)
Chloride: 109 mmol/L (ref 98–111)
Creatinine, Ser: 0.87 mg/dL (ref 0.44–1.00)
GFR calc Af Amer: >60 mL/min (ref >60)
GFR calc non Af Amer: >60 mL/min (ref >60)
Glucose, Bld: 98 mg/dL (ref 70–99)
Potassium: 3.7 mmol/L (ref 3.5–5.1)
Sodium: 141 mmol/L (ref 135–145)
Total Bilirubin: 0.2 mg/dL — ABNORMAL LOW (ref 0.3–1.2)
Total Protein: 5.2 g/dL — ABNORMAL LOW (ref 6.5–8.1)

## 2019-05-27 LAB — CBC
HCT: 28.4 % — ABNORMAL LOW (ref 36.0–46.0)
Hemoglobin: 9.4 g/dL — ABNORMAL LOW (ref 12.0–15.0)
MCH: 30.2 pg (ref 26.0–34.0)
MCHC: 33.1 g/dL (ref 30.0–36.0)
MCV: 91.3 fL (ref 80.0–100.0)
Platelets: 201 10*3/uL (ref 150–400)
RBC: 3.11 MIL/uL — ABNORMAL LOW (ref 3.87–5.11)
RDW: 13.9 % (ref 11.5–15.5)
WBC: 10.4 10*3/uL (ref 4.0–10.5)
nRBC: 0 % (ref 0.0–0.2)

## 2019-05-27 LAB — PREPARE RBC (CROSSMATCH)

## 2019-05-27 LAB — GLUCOSE, CAPILLARY: Glucose-Capillary: 90 mg/dL (ref 70–99)

## 2019-05-27 MED ORDER — PRENATAL MULTIVITAMIN CH: 1.0000 | ORAL_TABLET | Freq: Every day | ORAL | Status: DC

## 2019-05-27 MED ORDER — IBUPROFEN 600 MG PO TABS
600.0000 mg | ORAL_TABLET | Freq: Four times a day (QID) | ORAL | Status: DC | PRN
  Administered 2019-05-27: 600 mg via ORAL
  Filled 2019-05-27: qty 1

## 2019-05-27 MED ORDER — PROMETHAZINE HCL 25 MG/ML IJ SOLN
12.5000 mg | Freq: Once | INTRAMUSCULAR | Status: AC
  Administered 2019-05-27: 12.5 mg via INTRAVENOUS
  Filled 2019-05-27: qty 1

## 2019-05-27 MED ORDER — ACETAMINOPHEN 325 MG PO TABS
650.0000 mg | ORAL_TABLET | Freq: Once | ORAL | Status: AC
  Administered 2019-05-27: 02:00:00 650 mg via ORAL
  Filled 2019-05-27: qty 2

## 2019-05-27 MED ORDER — FAMOTIDINE 20 MG PO TABS
20.0000 mg | ORAL_TABLET | Freq: Once | ORAL | Status: AC
  Administered 2019-05-27: 09:00:00 20 mg via ORAL
  Filled 2019-05-27: qty 1

## 2019-05-27 MED ORDER — DIPHENHYDRAMINE HCL 25 MG PO CAPS
25.0000 mg | ORAL_CAPSULE | Freq: Once | ORAL | Status: AC
  Administered 2019-05-27: 02:00:00 25 mg via ORAL
  Filled 2019-05-27: qty 1

## 2019-05-27 MED ORDER — SODIUM CHLORIDE 0.9% IV SOLUTION: Freq: Once | INTRAVENOUS | Status: AC

## 2019-05-27 MED ORDER — PROMETHAZINE HCL 25 MG/ML IJ SOLN: 12.5000 mg | Freq: Four times a day (QID) | INTRAMUSCULAR | Status: DC | PRN

## 2019-05-27 NOTE — Progress Notes (Signed)
Patient discharged with printed instructions. Pt verbalized an understanding. No concerns noted. Shamari Lofquist L [place], RN 

## 2019-05-27 NOTE — H&P (Signed)
Chief Complaint: Fatigue   HPI: Lori Campbell is a 28 y.o. G1P1001 who presents to maternity admissions reporting dizziness, weakness, nausea and fatigue. States cannot sleep, "jerks awake" when tries to fall asleep. Admits to being anxious. Requesting fereheme.s/p SVD 12-14, received 2 units of blood due to hemorrhage from extensive vaginal lacerations while in hospital postpartum, discharge 12-16. Breastfeeding. She reports minimal vaginal bleeding, but no urinary symptoms, h/a, dizziness, n/v, or fever/chills.     Past Medical History:      Past Medical History:  Diagnosis Date  . Anxiety    does not take meds  . Chlamydia psittaci pneumonia 2017  . Contact with and (suspected) exposure to mold (toxic)   . Depression    does not take meds  . Dysmenorrhea   . Hypothyroidism   . Migraines   . MTHFR gene mutation ([facility])   . Thyroid disease    Past obstetric history:                  OB History  Gravida Para Term Preterm AB Living  1 1 1  0 0 1  SAB TAB Ectopic Multiple Live Births     0 0 0 0 1       # Outcome Date GA Lbr Len/2nd Weight Sex Delivery Anes PTL Lv  1 Term 05/23/19 [redacted]w[redacted]d 19:25 / 00:30 3969 g M Vag-Spont Gen  LIV   Past Surgical History:       Past Surgical History:  Procedure Laterality Date  . ABDOMINAL SURGERY     hernia  . HERNIA REPAIR    . WISDOM TOOTH EXTRACTION     Family History:  History reviewed. No pertinent family history.  Social History:  Social History        Tobacco Use  . Smoking status: Never Smoker  . Smokeless tobacco: Never Used  Substance Use Topics  . Alcohol use: Not Currently    Alcohol/week: 2.0 - 4.0 standard drinks    Types: 2 - 4 Standard drinks or equivalent per week  . Drug use: Never   Allergies:      Allergies  Allergen Reactions  . Lactose Intolerance (Gi)    Meds:         Medications Prior to Admission  Medication Sig Dispense Refill Last Dose  . Ascorbic Acid (VITAMIN C) 1000 MG tablet Take 2,000 mg by  mouth daily.   05/26/2019 at Unknown time  . CHOLINE PO Take by mouth.   05/26/2019 at Unknown time  . Docosahexaenoic Acid (DHA COMPLETE PO) Take by mouth.   05/26/2019 at Unknown time  . docusate sodium (COLACE) 100 MG capsule Take 100 mg by mouth 2 (two) times daily.   05/26/2019 at Unknown time  . ibuprofen (ADVIL) 600 MG tablet Take 1 tablet (600 mg total) by mouth every 6 (six) hours as needed for moderate pain or cramping. 40 tablet 1 05/26/2019 at 1900  . Lactobacillus (PROBIOTIC ACIDOPHILUS PO) Take by mouth.   05/26/2019 at Unknown time  . Loratadine (CLARITIN PO) Take by mouth.   05/26/2019 at Unknown time  . MAGNESIUM MALATE PO Take by mouth.   05/26/2019 at Unknown time  . Prenatal Vit-Fe Fumarate-FA (PRENATAL VITAMIN PO) Take by mouth.   05/26/2019 at Unknown time  . thyroid (ARMOUR) 30 MG tablet Take 60 mg by mouth daily before breakfast.    05/26/2019 at Unknown time  . UNABLE TO FIND Methyl-8 complex vitamin 1 lozenge daily   05/26/2019 at  Unknown time   I have reviewed patient's Past Medical Hx, Surgical Hx, Family Hx, Social Hx, medications and allergies.  ROS:  Review of Systems  Constitutional: Positive for fatigue. Negative for chills, diaphoresis and fever.  HENT: Negative for congestion.  Respiratory: Negative for cough.  Gastrointestinal: Positive for nausea. Negative for abdominal pain and vomiting.  Musculoskeletal: Negative for myalgias.  Neurological: Positive for dizziness, weakness and light-headedness. Negative for syncope and headaches.   Other systems negative  Physical Exam  Patient Vitals for the past 24 hrs:   BP Temp Pulse Resp SpO2 Height Weight  05/26/19 2314 122/75 -- 87 -- 100 % -- --  05/26/19 2307 -- -- -- -- 100 % -- --  05/26/19 2305 117/69 98.1 F (36.7 C) 88 18 -- 5\' 4"  (1.626 m) 81.2 kg   122/66 99 Lying  127/76 105 Sitting  127/71 113 Standing  Constitutional: Well-developed, well-nourished female in no acute distress.   Cardiovascular: normal rate and rhythm, no ectopy audible, S1 & S2 heard, no murmur  Respiratory: normal effort, no distress. Lungs CTAB with no wheezes or crackles  GI: Abd soft, non-tender. Nondistended. No rebound, No guarding. Bowel Sounds audible  MS: Extremities nontender, no edema, normal ROM  Neurologic: Alert and oriented x 4. Grossly nonfocal.  GU: Neg CVAT.  Skin: Warm and Dry  Psych: Affect appropriate.  PELVIC EXAM: Lochia small to moderate  Labs:  Lab Results Last 24 Hours                                                                                                                                                                           --/--/O POS, O POS  Performed at [facility] [address], [place], [place] [address]  [phone])  Imaging:  Imaging Results    .  Treatments in MAU included IV fluids, Zofran. These did not resolve her dizziness or nausea.    Assessment:  Postpartum Day #4 where she had hemorrhage from vaginal lacerations and required transfusion, now with appears to be still symptomatic anemia   Plan:  Admit to [facility]  Transfuse one unit of Packed Red Blood Cells  IV hydration

## 2019-05-27 NOTE — Progress Notes (Signed)
Patient ID: Lori Campbell, female   DOB: 1990-09-25, 28 y.o.   MRN: [id number]   Feeling better, got some sleep.  Wants to be be with her baby.  AFVSS gen NAD Abd soft, FFNT  Labs Hgb improving, kidney and liver function WNL  Will d/c home.

## 2019-05-27 NOTE — Discharge Summary (Signed)
OB Discharge Summary     Patient Name: Lori Campbell DOB: 04-May-1991 MRN: [id number]  Date of admission: 05/26/2019 Delivering MD: This patient has no babies on file.  Date of discharge: 05/27/2019  Admitting diagnosis: Symptomatic anemia [D64.9] Intrauterine pregnancy: Unknown     Secondary diagnosis:  Active Problems:   Symptomatic anemia  Additional problems: s/p SVD     Discharge diagnosis: Anemia and s/p transfusion x 1 u pRBC                                                                                                Post partum procedures:blood transfusion  Augmentation: N/A  Complications: None  Hospital course:  N/A  Physical exam  Vitals:   05/27/19 0423 05/27/19 0505 05/27/19 0739 05/27/19 1133  BP: 119/72 125/80 114/65 112/70  Pulse: 73 66 70 78  Resp: 18  14 17   Temp: 98.7 F (37.1 C)  98.7 F (37.1 C) 98.4 F (36.9 C)  TempSrc: Oral  Oral Oral  SpO2: 100%  99% 99%  Weight:      Height:       General: alert and no distress Lochia: appropriate Uterine Fundus: firm Incision: Healing well with no significant drainage DVT Evaluation: No evidence of DVT seen on physical exam. Labs: Lab Results  Component Value Date   WBC 10.4 05/27/2019   HGB 9.4 (L) 05/27/2019   HCT 28.4 (L) 05/27/2019   MCV 91.3 05/27/2019   PLT 201 05/27/2019   CMP Latest Ref Rng & Units 05/27/2019  Glucose 70 - 99 mg/dL 98  BUN 6 - 20 mg/dL 6  Creatinine 0.44 - 1.00 mg/dL 0.87  Sodium 135 - 145 mmol/L 141  Potassium 3.5 - 5.1 mmol/L 3.7  Chloride 98 - 111 mmol/L 109  CO2 22 - 32 mmol/L 24  Calcium 8.9 - 10.3 mg/dL 8.4(L)  Total Protein 6.5 - 8.1 g/dL 5.2(L)  Total Bilirubin 0.3 - 1.2 mg/dL 0.2(L)  Alkaline Phos 38 - 126 U/L 99  AST 15 - 41 U/L 25  ALT 0 - 44 U/L 26    Discharge instruction: per After Visit Summary and "Baby and Me Booklet".  After visit meds:  Allergies as of 05/27/2019      Reactions   Lactose Intolerance (gi)       Medication List     TAKE these medications   CHOLINE PO Take by mouth.   CLARITIN PO Take by mouth.   DHA COMPLETE PO Take by mouth.   docusate sodium 100 MG capsule Commonly known as: COLACE Take 100 mg by mouth 2 (two) times daily.   ibuprofen 600 MG tablet Commonly known as: ADVIL Take 1 tablet (600 mg total) by mouth every 6 (six) hours as needed for moderate pain or cramping.   MAGNESIUM MALATE PO Take by mouth.   PRENATAL VITAMIN PO Take by mouth.   PROBIOTIC ACIDOPHILUS PO Take by mouth.   thyroid 30 MG tablet Commonly known as: ARMOUR Take 60 mg by mouth daily before breakfast.   UNABLE TO FIND Methyl-8 complex vitamin 1 lozenge daily  vitamin C 1000 MG tablet Take 2,000 mg by mouth daily.       Diet: routine diet  Activity: Advance as tolerated. Pelvic rest for 6 weeks.   Outpatient follow up:as scheduled Follow up Appt: Future Appointments  Date Time Provider Department Center  06/30/2019 10:30 AM Shamleffer, Konrad Dolores, MD [facility] None   Follow up Visit:No follow-ups on file.  Postpartum contraception: Not Discussed   05/27/2019 Sherian Rein, MD

## 2019-05-28 LAB — TYPE AND SCREEN
ABO/RH(D): O POS
Antibody Screen: NEGATIVE
Unit division: 0

## 2019-05-28 LAB — BPAM RBC
Blood Product Expiration Date: 202012242359
ISSUE DATE / TIME: 202012180150
Unit Type and Rh: 5100

## 2019-06-28 ENCOUNTER — Other Ambulatory Visit: Payer: Self-pay

## 2019-06-30 ENCOUNTER — Ambulatory Visit (INDEPENDENT_AMBULATORY_CARE_PROVIDER_SITE_OTHER): Payer: 59 | Admitting: Internal Medicine

## 2019-06-30 ENCOUNTER — Encounter: Payer: Self-pay | Admitting: Internal Medicine

## 2019-06-30 ENCOUNTER — Other Ambulatory Visit: Payer: Self-pay

## 2019-06-30 VITALS — BP 104/68 | HR 83 | Temp 97.9°F | Ht 64.0 in | Wt 166.4 lb

## 2019-06-30 DIAGNOSIS — E039 Hypothyroidism, unspecified: Secondary | ICD-10-CM

## 2019-06-30 LAB — TSH: TSH: 0.28 u[IU]/mL — ABNORMAL LOW (ref 0.35–4.50)

## 2019-06-30 LAB — T4, FREE: Free T4: 0.6 ng/dL (ref 0.60–1.60)

## 2019-06-30 NOTE — Patient Instructions (Signed)
-   Please stop by the lab today, we will contact you with your results and any changes that are needed.

## 2019-06-30 NOTE — Progress Notes (Signed)
Name: Lori Campbell  MRN/ DOB: [id number], 1991-01-29    Age/ Sex: 29 y.o., female    PCP: [facility]   Reason for Endocrinology Evaluation: Hypothyroidism during pregnancy     Date of Initial Endocrinology Evaluation: 06/30/2019     HPI: Ms. Lori Campbell is a 29 y.o. female with a past medical history of hypothyroidism. The patient presented for initial endocrinology clinic visit on 06/30/2019 for consultative assistance with her hypothyroidism during pregnancy.   Pt was diagnosed with hypothyroidism 03/2018 believes TSh was around 2's , with lowT3 and T4 and was feeling bad at the time. She was tested and treated by [facility] and was initially on Levothyroxine but did not do too well on it. Armour thyroid was subsequently started at 30 mg daily, with gradual increase through pregnancy  reaching 60 mg.   Negative for hashimoto's Ab's   S/P delivery 05/23/19. Followed by bleeding at the episiotomy tears at  1100 mL   Today she is c/o fatigue, has episodes of feeling faint and adrenaline rushing, she feels like she is having a panic attack but its worse then her usual attack. She also has headaches , insomnia, and palpitations. Denies heat intolerance but endorses  cold intolerance.   She is nursing and milk production is "great" per pt  Mother and brother with hypothyroidism       HISTORY:  Past Medical History:  Past Medical History:  Diagnosis Date  . Anxiety    does not take meds  . Chlamydia psittaci pneumonia 2017  . Contact with and (suspected) exposure to mold (toxic)   . Depression    does not take meds  . Dysmenorrhea   . Hypothyroidism   . Migraines   . MTHFR gene mutation (HCC)   . Thyroid disease    Past Surgical History:  Past Surgical History:  Procedure Laterality Date  . ABDOMINAL SURGERY     hernia  . HERNIA REPAIR    . WISDOM TOOTH EXTRACTION        Social History:  reports that she has never  smoked. She has never used smokeless tobacco. She reports previous alcohol use of about 2.0 - 4.0 standard drinks of alcohol per week. She reports that she does not use drugs.  Family History: family history is not on file.   HOME MEDICATIONS: Allergies as of 06/30/2019      Reactions   Lactose Intolerance (gi)       Medication List       Accurate as of June 30, 2019 11:44 AM. If you have any questions, ask your nurse or doctor.        CHOLINE PO Take by mouth.   CLARITIN PO Take by mouth.   DHA COMPLETE PO Take by mouth.   docusate sodium 100 MG capsule Commonly known as: COLACE Take 100 mg by mouth 2 (two) times daily.   ibuprofen 600 MG tablet Commonly known as: ADVIL Take 1 tablet (600 mg total) by mouth every 6 (six) hours as needed for moderate pain or cramping.   Iron 90 (18 Fe) MG Tabs Take by mouth.   MAGNESIUM MALATE PO Take by mouth.   PRENATAL VITAMIN PO Take by mouth.   PROBIOTIC ACIDOPHILUS PO Take by mouth.   thyroid 30 MG tablet Commonly known as: ARMOUR Take 60 mg by mouth daily before breakfast.   UNABLE TO FIND Methyl-8 complex vitamin 1 lozenge daily   vitamin C  1000 MG tablet Take 2,000 mg by mouth daily.         REVIEW OF SYSTEMS: A comprehensive ROS was conducted with the patient and is negative except as per HPI .      OBJECTIVE:  VS: BP 104/68 (BP Location: Left Arm, Patient Position: Sitting, Cuff Size: Normal)   Pulse 83   Temp 97.9 F (36.6 C)   Ht 5\' 4"  (1.626 m)   Wt 166 lb 6.4 oz (75.5 kg)   SpO2 98%   BMI 28.56 kg/m    Wt Readings from Last 3 Encounters:  06/30/19 166 lb 6.4 oz (75.5 kg)  05/26/19 179 lb (81.2 kg)  05/23/19 190 lb (86.2 kg)     EXAM: General: Pt appears well and is in NAD  Eyes: External eye exam normal without stare, lid lag or exophthalmos.  EOM intact.   Neck: General: Supple without adenopathy. Thyroid: Thyroid size normal.  No goiter or nodules appreciated.  Lungs: Clear  with good BS bilat with no rales, rhonchi, or wheezes  Heart: Auscultation: RRR.  Abdomen: Normoactive bowel sounds, soft, nontender, without masses or organomegaly palpable  Extremities:  BL LE: No pretibial edema normal ROM and strength.  Skin: Hair: Texture and amount normal with gender appropriate distribution Skin Inspection: No rashes Skin Palpation: Skin temperature, texture, and thickness normal to palpation  Neuro: Cranial nerves: II - XII grossly intact  Motor: Normal strength throughout  Mental Status: Judgment, insight: Intact Orientation: Oriented to time, place, and person Mood and affect: No depression, anxiety, or agitation     DATA REVIEWED: Results for Lori Campbell, Lori Campbell (MRN Lenise Arena) as of 07/01/2019 07:27  Ref. Range 06/30/2019 11:05  TSH Latest Ref Range: 0.35 - 4.50 uIU/mL 0.28 (L)  Triiodothyronine (T3) Latest Ref Range: 76 - 181 ng/dL 07/02/2019  160) Latest Ref Range: 0.60 - 1.60 ng/dL V3,XTGG(YIRSWN   4.62 TSH 70/35/0093   ASSESSMENT/PLAN/RECOMMENDATIONS:   1. Hypothyroidism:    - Pt with multiple symptoms that could be attributed to her thyroid  - No local neck symptoms  -  I have advised the patient for my preference with Levothyroxine rather then armour thyroid, due to more stability with T4 content in levothyroxine and also armour thyroid has non-physiologic levels of T4:T3 . - We also discussed that thyroid hormone replacement is dependant on TSH levels rather then T3 and T4 levels, especially in the setting of armour thyroid intake, but pt requested T4 and T3 to be drawn, she understands that I will not be adjusting  thyroid medication regardless of T4 and ts levels but rather based on TSH levels due to instability.  - I have advised her to separate her vitamins by 4 hours from armour thyroid   Medications : Stop Armour Thyroid 60 mg  Armour thyroid 30 tabs, ONE and Half daily     2. Hx of episiotomy tear bleeds:   - Pt concerned  about sheehan's  syndrome because she knows people who had it following a bleed . I have assured her that pt with sheehan's syndrome will have no ability to nurse and the fact that she is able to nurse normally and described the milk supply as "great" is a great clinical indication for normal pituitary .   Labs today and in 6 weeks  F/U PRN - pending labs results   Signed electronically by: [id number], MD  [facility] Endocrinology  [facility] [address], [address] [place], [place]  Phone: [phone] FAX: [phone]   CC: Home[address] [place] [address] Phone: [phone] Fax: [phone]   Return to Endocrinology clinic as below: Future Appointments  Date Time Provider [place]  08/11/2019  9:00 AM [facility] None

## 2019-07-01 ENCOUNTER — Encounter: Payer: Self-pay | Admitting: Internal Medicine

## 2019-07-01 LAB — T3: T3, Total: 124 ng/dL (ref 76–181)

## 2019-07-01 MED ORDER — THYROID 30 MG PO TABS: 45.0000 mg | ORAL_TABLET | Freq: Every day | ORAL | 3 refills | Status: AC

## 2019-08-10 ENCOUNTER — Other Ambulatory Visit: Payer: Self-pay | Admitting: Internal Medicine

## 2019-08-10 DIAGNOSIS — E039 Hypothyroidism, unspecified: Secondary | ICD-10-CM

## 2019-08-11 ENCOUNTER — Other Ambulatory Visit: Payer: Self-pay

## 2019-08-11 ENCOUNTER — Other Ambulatory Visit (INDEPENDENT_AMBULATORY_CARE_PROVIDER_SITE_OTHER): Payer: 59

## 2019-08-11 DIAGNOSIS — E039 Hypothyroidism, unspecified: Secondary | ICD-10-CM

## 2019-08-11 LAB — TSH: TSH: 0.88 u[IU]/mL (ref 0.35–4.50)

## 2019-08-11 LAB — T4, FREE: Free T4: 0.62 ng/dL (ref 0.60–1.60)

## 2019-12-17 IMAGING — US US MFM OB DETAIL +14 WK
1 series · 14 of 28 positions shown · non-contrast
Comparison: none

[Series 1: us mfm ob detail +14 wk · 14 of 82 slices shown]
[im 4/82]
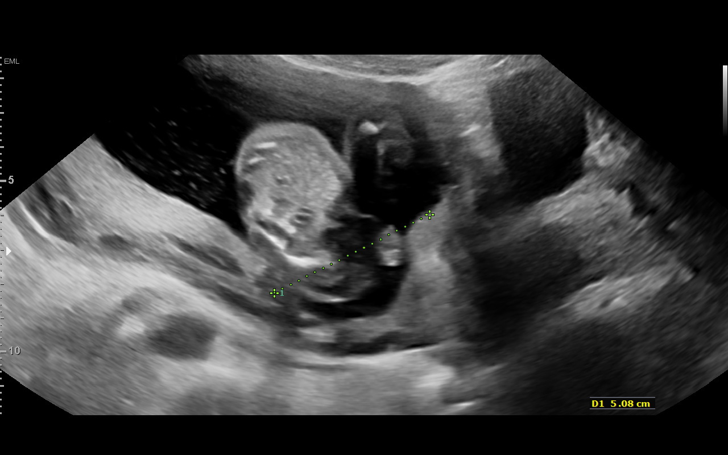
[im 10/82]
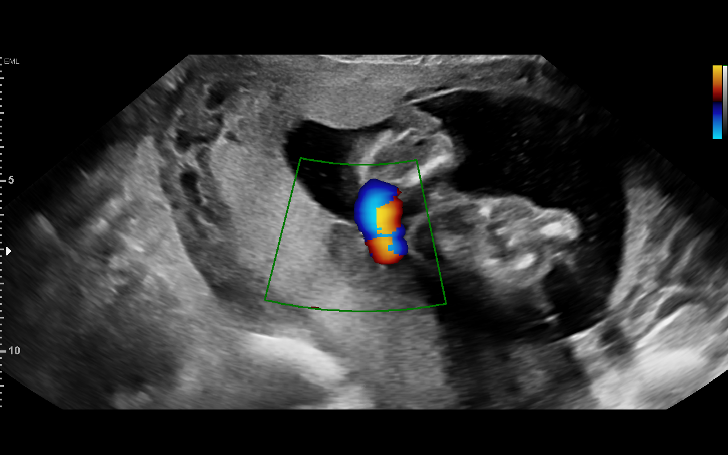
[im 16/82]
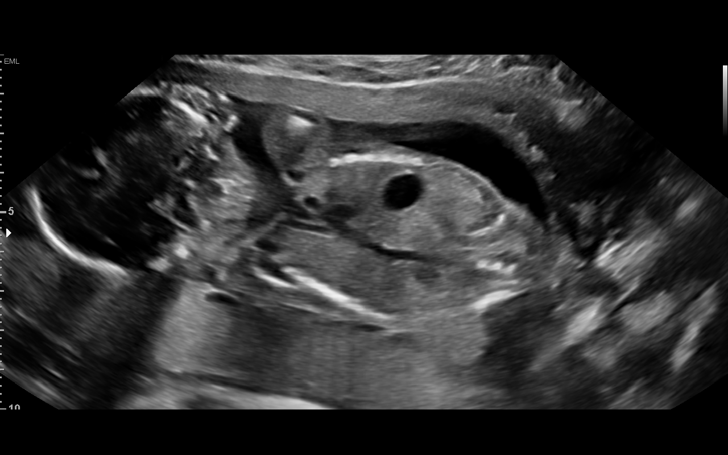
[im 22/82]
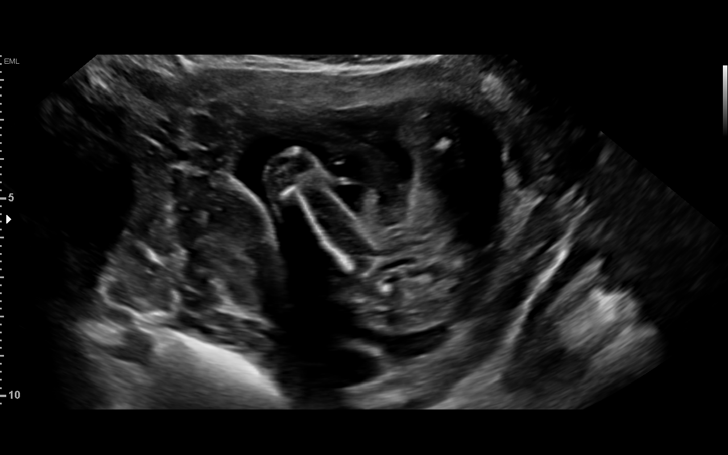
[im 28/82]
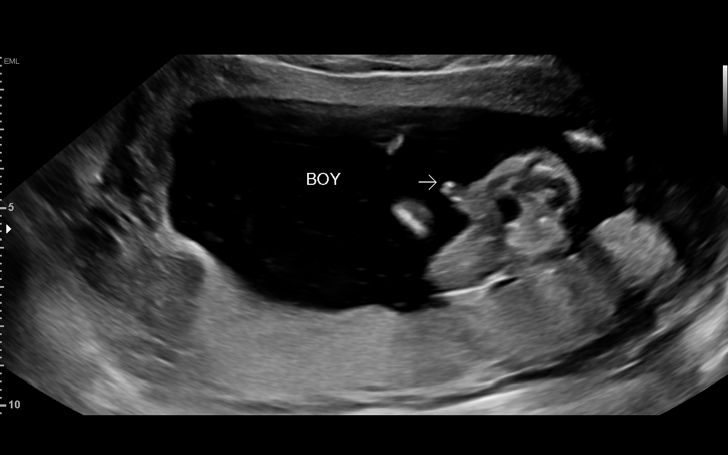
[im 34/82]
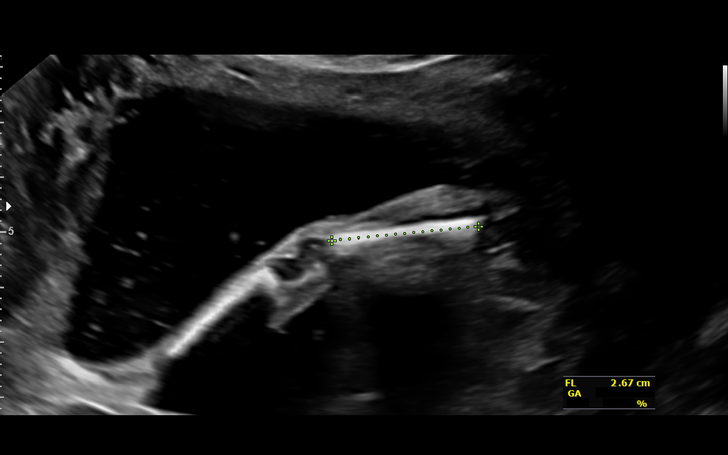
[im 40/82]
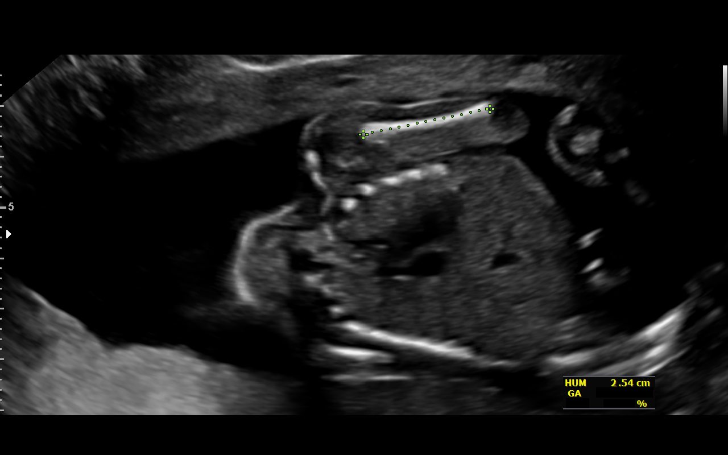
[im 46/82]
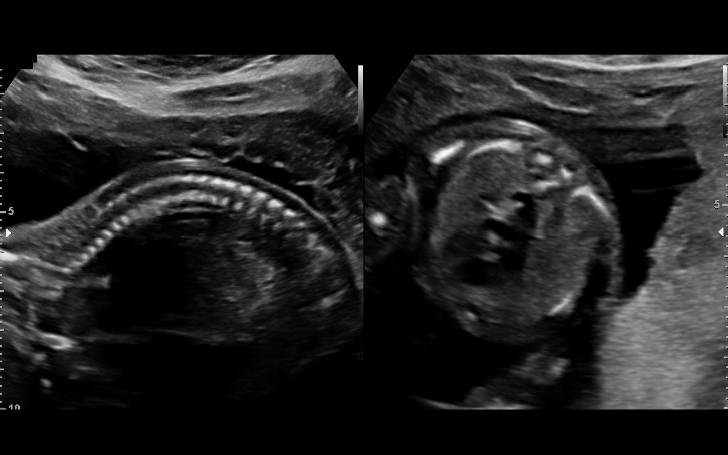
[im 52/82]
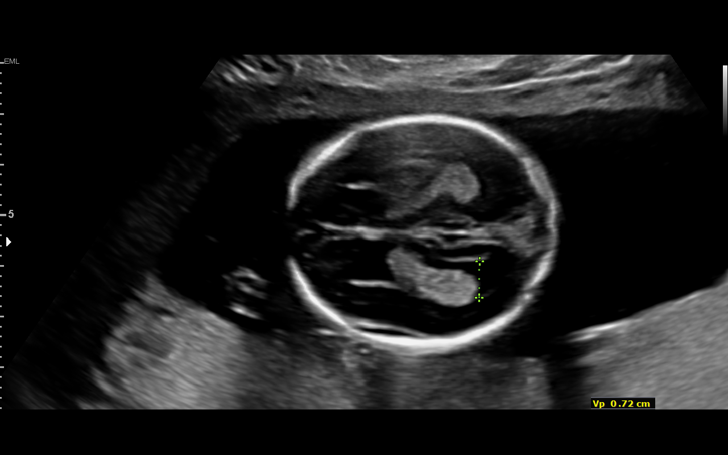
[im 58/82]
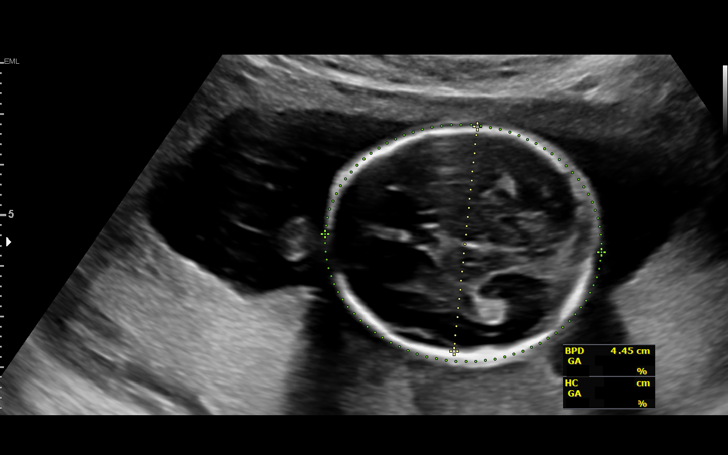
[im 64/82]
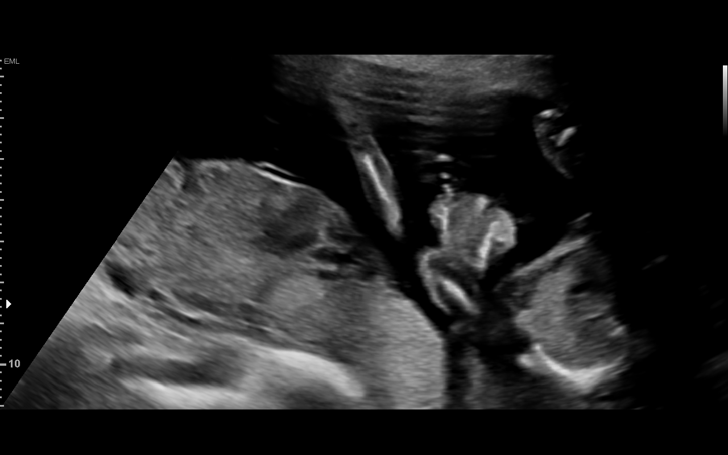
[im 70/82]
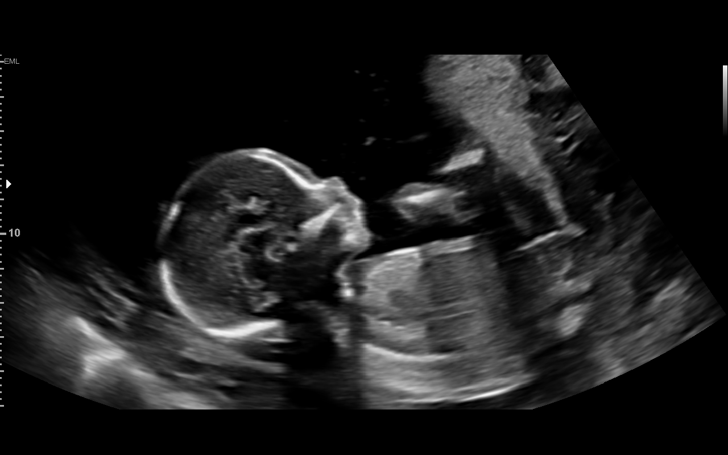
[im 76/82]
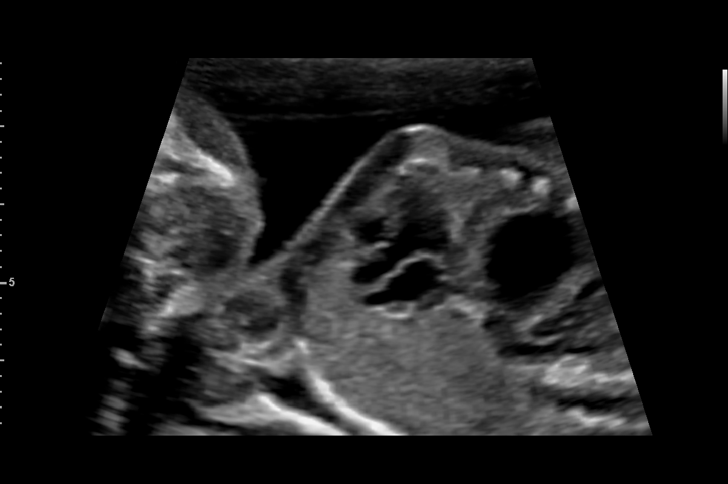
[im 82/82]
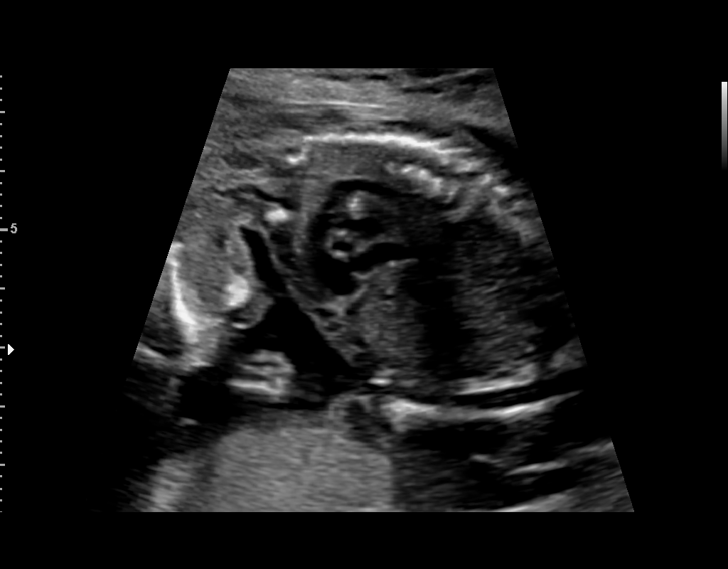

[14 of 28 positions shown; findings below may reference images not displayed]

#101

 ----------------------------------------------------------------------

 ----------------------------------------------------------------------
Indications

  18 weeks gestation of pregnancy
  Encounter for antenatal screening for
  malformations (no genetic testing)
  Hypothyroid
  Medical complication of pregnancy
  (Heterozygous methylenetetrahydrofolate
  reductase mutation)
 ----------------------------------------------------------------------
Vital Signs

 Weight (lb): 147
Fetal Evaluation

 Num Of Fetuses:         1
 Fetal Heart Rate(bpm):  153
 Cardiac Activity:       Observed
 Presentation:           Breech
 Placenta:               Posterior
 P. Cord Insertion:      Visualized, central

 Amniotic Fluid
 AFI FV:      Within normal limits

                             Largest Pocket(cm)

Biometry

 BPD:      45.1  mm     G. Age:  19w 4d         97  %    CI:        77.65   %    70 - 86
                                                         FL/HC:      17.0   %    15.8 - 18
 HC:       162   mm     G. Age:  19w 0d         86  %    HC/AC:      1.10        1.07 -
 AC:      147.1  mm     G. Age:  20w 0d         95  %    FL/BPD:     61.2   %
 FL:       27.6  mm     G. Age:  18w 3d         61  %    FL/AC:      18.8   %    20 - 24
 HUM:      25.9  mm     G. Age:  18w 1d         60  %
 CER:        19  mm     G. Age:  18w 4d         65  %
 NFT:       2.1  mm

 LV:        7.2  mm
 CM:        5.1  mm

 Est. FW:     282  gm    0 lb 10 oz      98  %
OB History

 Gravidity:    1         Term:   0        Prem:   0        SAB:   0
 TOP:          0       Ectopic:  0        Living: 0
Gestational Age

 LMP:           18w 0d        Date:  08/25/18                 EDD:   06/01/19
 U/S Today:     19w 2d                                        EDD:   05/23/19
 Best:          18w 0d     Det. By:  LMP  (08/25/18)          EDD:   06/01/19
Anatomy

 Cranium:               Appears normal         LVOT:                   Appears normal
 Cavum:                 Appears normal         Aortic Arch:            Appears normal
 Ventricles:            Appears normal         Ductal Arch:            Appears normal
 Choroid Plexus:        Appears normal         Diaphragm:              Appears normal
 Cerebellum:            Appears normal         Stomach:                Appears normal, left
                                                                       sided
 Posterior Fossa:       Appears normal         Abdomen:                Appears normal
 Nuchal Fold:           Appears normal         Abdominal Wall:         Appears nml (cord
                                                                       insert, abd wall)
 Face:                  Appears normal         Cord Vessels:           Appears normal (3
                        (orbits and profile)                           vessel cord)
 Lips:                  Appears normal         Kidneys:                Appear normal
 Palate:                Appears normal         Bladder:                Appears normal
 Thoracic:              Appears normal         Spine:                  Appears normal
 Heart:                 Appears normal         Upper Extremities:      Appears normal
                        (4CH, axis, and
                        situs)
 RVOT:                  Appears normal         Lower Extremities:      Appears normal

 Other:  Heels and 5th digit visualized. Nasal bone visualized. Fetus appears
         to be a male.
Cervix Uterus Adnexa

 Cervix
 Length:            3.8  cm.
 Normal appearance by transabdominal scan.

 Left Ovary
 Not visualized.

 Right Ovary
 Not visualized.

 Adnexa
 No abnormality visualized.
Impression

 We performed fetal anatomy scan. No makers of
 aneuploidies or fetal structural defects are seen. Fetal
 biometry is consistent with her previously-established dates.
 Amniotic fluid is normal and good fetal activity is seen.
 Patient understands the limitations of ultrasound in detecting
 fetal anomalies.
 Patient had opted not to screen for fetal aneuploidies. Patient
 has hypothyroidism and is taking thyroid supplements.
Recommendations

 -An appointment was made for her to return in 8 weeks for
 fetal growth assessment.
                 Cancino, Damiana
